# Patient Record
Sex: Female | Born: 1975 | Race: Black or African American | Hispanic: No | Marital: Married | Smoking: Never smoker
Health system: Southern US, Community
[De-identification: ages and names within clinical notes are randomized; demographics above are authoritative.]

## PROBLEM LIST (undated history)

## (undated) DIAGNOSIS — I82409 Acute embolism and thrombosis of unspecified deep veins of unspecified lower extremity: Secondary | ICD-10-CM

## (undated) DIAGNOSIS — I2699 Other pulmonary embolism without acute cor pulmonale: Secondary | ICD-10-CM

## (undated) HISTORY — DX: Acute embolism and thrombosis of unspecified deep veins of unspecified lower extremity: I82.409

## (undated) HISTORY — DX: Other pulmonary embolism without acute cor pulmonale: I26.99

---

## 2019-11-23 ENCOUNTER — Emergency Department (HOSPITAL_BASED_OUTPATIENT_CLINIC_OR_DEPARTMENT_OTHER)
Admission: EM | Admit: 2019-11-23 | Discharge: 2019-11-23 | Disposition: A | Discharge status: Home / Self Care | Payer: Managed Care, Other (non HMO) | Attending: Emergency Medicine | Admitting: Emergency Medicine

## 2019-11-23 ENCOUNTER — Emergency Department (HOSPITAL_BASED_OUTPATIENT_CLINIC_OR_DEPARTMENT_OTHER): Payer: Managed Care, Other (non HMO)

## 2019-11-23 ENCOUNTER — Other Ambulatory Visit: Payer: Self-pay

## 2019-11-23 ENCOUNTER — Encounter (HOSPITAL_BASED_OUTPATIENT_CLINIC_OR_DEPARTMENT_OTHER): Payer: Self-pay | Admitting: *Deleted

## 2019-11-23 DIAGNOSIS — M79604 Pain in right leg: Secondary | ICD-10-CM

## 2019-11-23 LAB — URINALYSIS, ROUTINE W REFLEX MICROSCOPIC
Bilirubin Urine: NEGATIVE
Glucose, UA: NEGATIVE mg/dL
Hgb urine dipstick: NEGATIVE
Ketones, ur: 40 mg/dL — AB
Leukocytes,Ua: NEGATIVE
Nitrite: NEGATIVE
Protein, ur: NEGATIVE mg/dL
Specific Gravity, Urine: 1.02 (ref 1.005–1.030)
pH: 6 (ref 5.0–8.0)

## 2019-11-23 LAB — BASIC METABOLIC PANEL
Anion gap: 11 (ref 5–15)
BUN: 15 mg/dL (ref 6–20)
CO2: 21 mmol/L — ABNORMAL LOW (ref 22–32)
Calcium: 9.1 mg/dL (ref 8.9–10.3)
Chloride: 106 mmol/L (ref 98–111)
Creatinine, Ser: 0.74 mg/dL (ref 0.44–1.00)
GFR calc Af Amer: >60 mL/min (ref >60)
GFR calc non Af Amer: >60 mL/min (ref >60)
Glucose, Bld: 96 mg/dL (ref 70–99)
Potassium: 4 mmol/L (ref 3.5–5.1)
Sodium: 138 mmol/L (ref 135–145)

## 2019-11-23 LAB — CBC
HCT: 39.3 % (ref 36.0–46.0)
Hemoglobin: 12.8 g/dL (ref 12.0–15.0)
MCH: 28.8 pg (ref 26.0–34.0)
MCHC: 32.6 g/dL (ref 30.0–36.0)
MCV: 88.3 fL (ref 80.0–100.0)
Platelets: 228 10*3/uL (ref 150–400)
RBC: 4.45 MIL/uL (ref 3.87–5.11)
RDW: 13.2 % (ref 11.5–15.5)
WBC: 5.6 10*3/uL (ref 4.0–10.5)
nRBC: 0 % (ref 0.0–0.2)

## 2019-11-23 LAB — PREGNANCY, URINE: Preg Test, Ur: NEGATIVE

## 2019-11-23 NOTE — ED Notes (Signed)
ED Provider at bedside. 

## 2019-11-23 NOTE — ED Triage Notes (Signed)
Right leg pain from her calf to her upper leg. Hx of blood clots. She is ambulatory.

## 2019-11-23 NOTE — Discharge Instructions (Addendum)
Work-up for the right hip and thigh pain without evidence of a deep vein thrombosis.  X-rays of the pelvis and hip without any significant bony abnormalities.  Still possible could be coming from the back area.  Or could be muscles around the hip that are causing the pain.  Recommend he follow-up with sports medicine upstairs.  They will be okay with take it from here in terms of needing an MRI or physical therapy.  In the meantime would recommend Motrin Advil or Aleve Naprosyn for the pain.

## 2019-11-23 NOTE — ED Provider Notes (Signed)
[facility] EMERGENCY DEPARTMENT Provider Note   CSN: [id number] Arrival date & time: 11/23/19  1728     History Chief Complaint  Patient presents with  . Leg Pain    Annette Lewis is a 44 y.o. female.  Patient with a complaint of kind of right hip down to knee pain.  Worse with flexion at the hip.  Some of the pain goes into the calf area.  All on the right side.  No history of any injury.  Is been bothering her for months.  Been getting worse lately.  Denies any significant numbness to the foot or weakness to the foot.  Denies any back pain.        History reviewed. No pertinent past medical history.  There are no problems to display for this patient.   History reviewed. No pertinent surgical history.   OB History   No obstetric history on file.     No family history on file.  Social History   Tobacco Use  . Smoking status: Never Smoker  . Smokeless tobacco: Never Used  Substance Use Topics  . Alcohol use: Not Currently  . Drug use: Never    Home Medications Prior to Admission medications   Not on File    Allergies    Patient has no known allergies.  Review of Systems   Review of Systems  Constitutional: Negative for chills and fever.  HENT: Negative for congestion, rhinorrhea and sore throat.   Eyes: Negative for visual disturbance.  Respiratory: Negative for cough and shortness of breath.   Cardiovascular: Negative for chest pain and leg swelling.  Gastrointestinal: Negative for abdominal pain, diarrhea, nausea and vomiting.  Genitourinary: Negative for dysuria.  Musculoskeletal: Negative for arthralgias, back pain, joint swelling and neck pain.  Skin: Negative for rash.  Neurological: Negative for dizziness, light-headedness and headaches.  Hematological: Does not bruise/bleed easily.  Psychiatric/Behavioral: Negative for confusion.    Physical Exam Updated Vital Signs BP 128/74   Pulse 84   Temp 99.2 F (37.3 C) (Oral)    Resp 18   Ht 1.588 m (5' 2.5")   Wt 93 kg   LMP 11/11/2019   SpO2 98%   BMI 36.90 kg/m   Physical Exam Vitals and nursing note reviewed.  Constitutional:      General: She is not in acute distress.    Appearance: She is well-developed.  HENT:     Head: Normocephalic and atraumatic.  Eyes:     Extraocular Movements: Extraocular movements intact.     Conjunctiva/sclera: Conjunctivae normal.     Pupils: Pupils are equal, round, and reactive to light.  Cardiovascular:     Rate and Rhythm: Normal rate and regular rhythm.     Heart sounds: No murmur.  Pulmonary:     Effort: Pulmonary effort is normal. No respiratory distress.     Breath sounds: Normal breath sounds.  Abdominal:     Palpations: Abdomen is soft.     Tenderness: There is no abdominal tenderness.  Musculoskeletal:        General: Swelling present. No tenderness or deformity. Normal range of motion.     Cervical back: Normal range of motion and neck supple.     Comments: May be some slight trace edema around the right ankle area.  Dorsalis pedis pulse 2+.  Good movement of ankle foot and knee.  But has some discomfort when she flexes her hip area.  Pain tends to radiate more to the lateral  aspect of the thigh and then sometimes down towards the top of the calf.  Skin:    General: Skin is warm and dry.     Capillary Refill: Capillary refill takes less than 2 seconds.  Neurological:     General: No focal deficit present.     Mental Status: She is alert and oriented to person, place, and time.     Cranial Nerves: No cranial nerve deficit.     Sensory: No sensory deficit.     Motor: No weakness.     ED Results / Procedures / Treatments   Labs (all labs ordered are listed, but only abnormal results are displayed) Labs Reviewed  BASIC METABOLIC PANEL - Abnormal; Notable for the following components:      Result Value   CO2 21 (*)    All other components within normal limits  URINALYSIS, ROUTINE W REFLEX MICROSCOPIC  - Abnormal; Notable for the following components:   APPearance CLOUDY (*)    Ketones, ur 40 (*)    All other components within normal limits  CBC  PREGNANCY, URINE    EKG None  Radiology US Venous Img Lower Right (DVT Study)  Result Date: 11/23/2019 CLINICAL DATA:  Right lower extremity pain.  History of DVT. EXAM: RIGHT LOWER EXTREMITY VENOUS DOPPLER ULTRASOUND TECHNIQUE: Gray-scale sonography with compression, as well as color and duplex ultrasound, were performed to evaluate the deep venous system(s) from the level of the common femoral vein through the popliteal and proximal calf veins. COMPARISON:  None. FINDINGS: VENOUS Normal compressibility of the common femoral, superficial femoral, and popliteal veins, as well as the visualized calf veins. Visualized portions of profunda femoral vein and great saphenous vein unremarkable. No filling defects to suggest DVT on grayscale or color Doppler imaging. Doppler waveforms show normal direction of venous flow, normal respiratory phasicity and response to augmentation. Limited views of the contralateral common femoral vein are unremarkable. OTHER None. Limitations: none IMPRESSION: No evidence of right lower extremity DVT. Electronically Signed   By: Keith Rake M.D.   On: 11/23/2019 19:41   DG Hip Unilat W or Wo Pelvis 2-3 Views Right  Result Date: 11/23/2019 CLINICAL DATA:  Right leg and thigh pain EXAM: DG HIP (WITH OR WITHOUT PELVIS) 2-3V RIGHT COMPARISON:  None. FINDINGS: Frontal view of the pelvis as well as frontal and frogleg lateral views of the right hip are obtained. No fracture, subluxation, or dislocation within either hip. Enthesopathic changes are seen along the greater trochanters and iliac crests bilaterally. Joint spaces are relatively well preserved. Soft tissues are normal. IMPRESSION: 1. No acute bony abnormality. 2. Prominent enthesopathic changes.  No significant arthritis. Electronically Signed   By: Randa Ngo M.D.    On: 11/23/2019 19:46    Procedures Procedures (including critical care time)  Medications Ordered in ED Medications - No data to display  ED Course  I have reviewed the triage vital signs and the nursing notes.  Pertinent labs & imaging results that were available during my care of the patient were reviewed by me and considered in my medical decision making (see chart for details).    MDM Rules/Calculators/A&P                     Work-up including Doppler study the right leg negative for DVT.  X-rays of the right hip and pelvis without any significant bony abnormalities.  Patient with chronic pain in that area.  Getting worse.  Will refer to sports  medicine.  Could be hip muscle type pain still possibly could be a nerve problem from the lower lumbar back area.  All acute problems have been ruled out appropriately.  Final Clinical Impression(s) / ED Diagnoses Final diagnoses:  Right leg pain    Rx / DC Orders ED Discharge Orders    None       Vanetta Mulders, MD 11/23/19 2051

## 2021-01-26 ENCOUNTER — Emergency Department (HOSPITAL_BASED_OUTPATIENT_CLINIC_OR_DEPARTMENT_OTHER): Payer: 59

## 2021-01-26 ENCOUNTER — Encounter (HOSPITAL_BASED_OUTPATIENT_CLINIC_OR_DEPARTMENT_OTHER): Payer: Self-pay | Admitting: *Deleted

## 2021-01-26 ENCOUNTER — Other Ambulatory Visit: Payer: Self-pay

## 2021-01-26 ENCOUNTER — Inpatient Hospital Stay (HOSPITAL_BASED_OUTPATIENT_CLINIC_OR_DEPARTMENT_OTHER)
Admission: EM | Admit: 2021-01-26 | Discharge: 2021-01-30 | DRG: 175 | Disposition: A | Discharge status: Home / Self Care | Payer: 59 | Attending: Internal Medicine | Admitting: Internal Medicine

## 2021-01-26 DIAGNOSIS — D689 Coagulation defect, unspecified: Secondary | ICD-10-CM | POA: Diagnosis present

## 2021-01-26 DIAGNOSIS — Z86718 Personal history of other venous thrombosis and embolism: Secondary | ICD-10-CM

## 2021-01-26 DIAGNOSIS — I2699 Other pulmonary embolism without acute cor pulmonale: Secondary | ICD-10-CM | POA: Diagnosis present

## 2021-01-26 DIAGNOSIS — I2602 Saddle embolus of pulmonary artery with acute cor pulmonale: Secondary | ICD-10-CM | POA: Diagnosis not present

## 2021-01-26 DIAGNOSIS — R0602 Shortness of breath: Secondary | ICD-10-CM | POA: Diagnosis not present

## 2021-01-26 DIAGNOSIS — E876 Hypokalemia: Secondary | ICD-10-CM | POA: Diagnosis not present

## 2021-01-26 DIAGNOSIS — I749 Embolism and thrombosis of unspecified artery: Secondary | ICD-10-CM

## 2021-01-26 DIAGNOSIS — I959 Hypotension, unspecified: Secondary | ICD-10-CM | POA: Diagnosis present

## 2021-01-26 DIAGNOSIS — Z20822 Contact with and (suspected) exposure to covid-19: Secondary | ICD-10-CM | POA: Diagnosis present

## 2021-01-26 DIAGNOSIS — D649 Anemia, unspecified: Secondary | ICD-10-CM | POA: Diagnosis present

## 2021-01-26 DIAGNOSIS — R778 Other specified abnormalities of plasma proteins: Secondary | ICD-10-CM

## 2021-01-26 DIAGNOSIS — D696 Thrombocytopenia, unspecified: Secondary | ICD-10-CM | POA: Diagnosis present

## 2021-01-26 DIAGNOSIS — Z6841 Body Mass Index (BMI) 40.0 and over, adult: Secondary | ICD-10-CM

## 2021-01-26 LAB — CBC WITH DIFFERENTIAL/PLATELET
Abs Immature Granulocytes: 0.02 10*3/uL (ref 0.00–0.07)
Basophils Absolute: 0 10*3/uL (ref 0.0–0.1)
Basophils Relative: 0 %
Eosinophils Absolute: 0.1 10*3/uL (ref 0.0–0.5)
Eosinophils Relative: 1 %
HCT: 36.8 % (ref 36.0–46.0)
Hemoglobin: 12.8 g/dL (ref 12.0–15.0)
Immature Granulocytes: 0 %
Lymphocytes Relative: 26 %
Lymphs Abs: 1.8 10*3/uL (ref 0.7–4.0)
MCH: 28.3 pg (ref 26.0–34.0)
MCHC: 34.8 g/dL (ref 30.0–36.0)
MCV: 81.2 fL (ref 80.0–100.0)
Monocytes Absolute: 0.4 10*3/uL (ref 0.1–1.0)
Monocytes Relative: 6 %
Neutro Abs: 4.5 10*3/uL (ref 1.7–7.7)
Neutrophils Relative %: 67 %
Platelets: 207 10*3/uL (ref 150–400)
RBC: 4.53 MIL/uL (ref 3.87–5.11)
RDW: 13.1 % (ref 11.5–15.5)
WBC: 6.9 10*3/uL (ref 4.0–10.5)
nRBC: 0 % (ref 0.0–0.2)

## 2021-01-26 LAB — COMPREHENSIVE METABOLIC PANEL
ALT: 17 U/L (ref 0–44)
AST: 23 U/L (ref 15–41)
Albumin: 3.7 g/dL (ref 3.5–5.0)
Alkaline Phosphatase: 53 U/L (ref 38–126)
Anion gap: 11 (ref 5–15)
BUN: 14 mg/dL (ref 6–20)
CO2: 21 mmol/L — ABNORMAL LOW (ref 22–32)
Calcium: 8.9 mg/dL (ref 8.9–10.3)
Chloride: 106 mmol/L (ref 98–111)
Creatinine, Ser: 0.72 mg/dL (ref 0.44–1.00)
GFR, Estimated: >60 mL/min (ref >60)
Glucose, Bld: 102 mg/dL — ABNORMAL HIGH (ref 70–99)
Potassium: 3.4 mmol/L — ABNORMAL LOW (ref 3.5–5.1)
Sodium: 138 mmol/L (ref 135–145)
Total Bilirubin: 0.3 mg/dL (ref 0.3–1.2)
Total Protein: 7 g/dL (ref 6.5–8.1)

## 2021-01-26 LAB — BRAIN NATRIURETIC PEPTIDE: B Natriuretic Peptide: 205.1 pg/mL — ABNORMAL HIGH (ref 0.0–100.0)

## 2021-01-26 LAB — RESP PANEL BY RT-PCR (FLU A&B, COVID) ARPGX2
Influenza A by PCR: NEGATIVE
Influenza B by PCR: NEGATIVE
SARS Coronavirus 2 by RT PCR: NEGATIVE

## 2021-01-26 LAB — TROPONIN I (HIGH SENSITIVITY)
Troponin I (High Sensitivity): 190 ng/L (ref <18)
Troponin I (High Sensitivity): 172 ng/L (ref <18)

## 2021-01-26 LAB — LIPASE, BLOOD: Lipase: 31 U/L (ref 11–51)

## 2021-01-26 LAB — APTT: aPTT: 26 seconds (ref 24–36)

## 2021-01-26 LAB — TSH: TSH: 3.59 u[IU]/mL (ref 0.350–4.500)

## 2021-01-26 LAB — MAGNESIUM: Magnesium: 1.9 mg/dL (ref 1.7–2.4)

## 2021-01-26 LAB — D-DIMER, QUANTITATIVE: D-Dimer, Quant: 9.71 ug/mL-FEU — ABNORMAL HIGH (ref 0.00–0.50)

## 2021-01-26 LAB — PROTIME-INR
INR: 1 (ref 0.8–1.2)
Prothrombin Time: 13.2 seconds (ref 11.4–15.2)

## 2021-01-26 MED ORDER — SODIUM CHLORIDE 0.9 % IV BOLUS
500.0000 mL | Freq: Once | INTRAVENOUS | Status: AC
  Administered 2021-01-26: 500 mL via INTRAVENOUS

## 2021-01-26 MED ORDER — HEPARIN BOLUS VIA INFUSION
4000.0000 [IU] | Freq: Once | INTRAVENOUS | Status: AC
  Administered 2021-01-26: 4000 [IU] via INTRAVENOUS

## 2021-01-26 MED ORDER — HEPARIN (PORCINE) 25000 UT/250ML-% IV SOLN
1400.0000 [IU]/h | INTRAVENOUS | Status: DC
  Administered 2021-01-26: 1400 [IU]/h via INTRAVENOUS
  Filled 2021-01-26: qty 250

## 2021-01-26 MED ORDER — IOHEXOL 350 MG/ML SOLN
75.0000 mL | Freq: Once | INTRAVENOUS | Status: AC | PRN
  Administered 2021-01-26: 75 mL via INTRAVENOUS

## 2021-01-26 NOTE — ED Provider Notes (Signed)
[facility] EMERGENCY DEPARTMENT Provider Note   CSN: [id number] Arrival date & time: 01/26/21  1842     History No chief complaint on file.   Annette Lewis is a 45 y.o. female presents to the Emergency Department complaining of gradual, persistent, progressively worsening palpitations and shortness of breath.  Patient reports that she has been utilizing the keto diet since the beginning of May and has felt well until around 3 AM on Wednesday morning when she was returning home after her overnight shift.  She reports at that time she had a near syncopal episode and felt short of breath.  She reports that she went home and drink some orange juice thinking her blood sugar might be low.  This helped her lightheadedness she is have persistent tachycardia, palpitations and shortness of breath since that time.  Patient reports approximately 8 weeks ago she went to [place] but has had no other travel in the last few weeks.  She reports several years ago she had a DVT secondary to regular travel.  She is not anticoagulated.  Has never had a pulmonary embolism.  Patient reports any walking creates significant shortness of breath.  Denies fever, chills, headache, neck pain, chest pain abdominal pain, nausea, vomiting, diarrhea weakness, urinary symptoms.   The history is provided by the patient and medical records. No language interpreter was used.       History reviewed. No pertinent past medical history.  Patient Active Problem List   Diagnosis Date Noted  . Pulmonary emboli (HCC) 01/26/2021  . Pulmonary embolus (HCC) 01/26/2021    Past Surgical History:  Procedure Laterality Date  . CESAREAN SECTION       OB History   No obstetric history on file.     No family history on file.  Social History   Tobacco Use  . Smoking status: Never Smoker  . Smokeless tobacco: Never Used  Substance Use Topics  . Alcohol use: Not Currently  . Drug use: Never    Home  Medications Prior to Admission medications   Not on File    Allergies    Patient has no known allergies.  Review of Systems   Review of Systems  Constitutional: Positive for fatigue. Negative for appetite change, diaphoresis, fever and unexpected weight change.  HENT: Negative for mouth sores.   Eyes: Negative for visual disturbance.  Respiratory: Positive for chest tightness and shortness of breath. Negative for cough and wheezing.   Cardiovascular: Positive for palpitations. Negative for chest pain.  Gastrointestinal: Negative for abdominal pain, constipation, diarrhea, nausea and vomiting.  Endocrine: Negative for polydipsia, polyphagia and polyuria.  Genitourinary: Negative for dysuria, frequency, hematuria and urgency.  Musculoskeletal: Negative for back pain and neck stiffness.  Skin: Negative for rash.  Allergic/Immunologic: Negative for immunocompromised state.  Neurological: Positive for light-headedness. Negative for syncope and headaches.  Hematological: Does not bruise/bleed easily.  Psychiatric/Behavioral: Negative for sleep disturbance. The patient is not nervous/anxious.     Physical Exam Updated Vital Signs BP (!) 97/58   Pulse (!) 116   Temp 97.9 F (36.6 C) (Oral)   Resp 18   Ht 5' 2.5" (1.588 m)   LMP 01/24/2021   SpO2 99%   BMI 36.90 kg/m   Physical Exam Vitals and nursing note reviewed.  Constitutional:      General: She is not in acute distress.    Appearance: She is not diaphoretic.  HENT:     Head: Normocephalic.  Eyes:  General: No scleral icterus.    Conjunctiva/sclera: Conjunctivae normal.  Cardiovascular:     Rate and Rhythm: Regular rhythm. Tachycardia present.     Pulses: Normal pulses.          Radial pulses are 2+ on the right side and 2+ on the left side.       Dorsalis pedis pulses are 2+ on the right side and 2+ on the left side.  Pulmonary:     Effort: No tachypnea, accessory muscle usage, prolonged expiration, respiratory  distress or retractions.     Breath sounds: Normal breath sounds. No stridor.     Comments: Equal chest rise. No increased work of breathing. Abdominal:     General: There is no distension.     Palpations: Abdomen is soft.     Tenderness: There is no abdominal tenderness. There is no guarding or rebound.  Musculoskeletal:     Cervical back: Normal range of motion.     Right lower leg: No edema.     Left lower leg: No edema.     Comments: Moves all extremities equally and without difficulty.  Skin:    General: Skin is warm and dry.     Capillary Refill: Capillary refill takes less than 2 seconds.  Neurological:     Mental Status: She is alert.     GCS: GCS eye subscore is 4. GCS verbal subscore is 5. GCS motor subscore is 6.     Comments: Speech is clear and goal oriented.  Psychiatric:        Mood and Affect: Mood normal.     ED Results / Procedures / Treatments   Labs (all labs ordered are listed, but only abnormal results are displayed) Labs Reviewed  COMPREHENSIVE METABOLIC PANEL - Abnormal; Notable for the following components:      Result Value   Potassium 3.4 (*)    CO2 21 (*)    Glucose, Bld 102 (*)    All other components within normal limits  D-DIMER, QUANTITATIVE - Abnormal; Notable for the following components:   D-Dimer, Quant 9.71 (*)    All other components within normal limits  BRAIN NATRIURETIC PEPTIDE - Abnormal; Notable for the following components:   B Natriuretic Peptide 205.1 (*)    All other components within normal limits  TROPONIN I (HIGH SENSITIVITY) - Abnormal; Notable for the following components:   Troponin I (High Sensitivity) 190 (*)    All other components within normal limits  TROPONIN I (HIGH SENSITIVITY) - Abnormal; Notable for the following components:   Troponin I (High Sensitivity) 172 (*)    All other components within normal limits  RESP PANEL BY RT-PCR (FLU A&B, COVID) ARPGX2  CBC WITH DIFFERENTIAL/PLATELET  LIPASE, BLOOD  TSH   MAGNESIUM  PROTIME-INR  APTT  PREGNANCY, URINE  URINALYSIS, ROUTINE W REFLEX MICROSCOPIC  HEPARIN LEVEL (UNFRACTIONATED)  CBC  TROPONIN I (HIGH SENSITIVITY)    EKG EKG Interpretation  Date/Time:  Thursday Jan 26 2021 18:52:05 EDT Ventricular Rate:  121 PR Interval:  134 QRS Duration: 72 QT Interval:  330 QTC Calculation: 468 R Axis:   50 Text Interpretation: Sinus tachycardia Low voltage QRS Cannot rule out Anterior infarct , age undetermined Abnormal ECG No previous ECGs available Confirmed by Richardean CanalYao, David H ([id number]) on 01/26/2021 6:57:01 PM   Radiology DG Chest 2 View  Result Date: 01/26/2021 CLINICAL DATA:  45 year old female with shortness of breath. EXAM: CHEST - 2 VIEW COMPARISON:  None. FINDINGS: Shallow inspiration. No  focal consolidation, pleural effusion, or pneumothorax. The cardiac silhouette is within limits. No acute osseous pathology. IMPRESSION: No active cardiopulmonary disease. Electronically Signed   By: Elgie Collard M.D.   On: 01/26/2021 20:56   CT Angio Chest PE W and/or Wo Contrast  Result Date: 01/26/2021 CLINICAL DATA:  Short of breath and dizzy EXAM: CT ANGIOGRAPHY CHEST WITH CONTRAST TECHNIQUE: Multidetector CT imaging of the chest was performed using the standard protocol during bolus administration of intravenous contrast. Multiplanar CT image reconstructions and MIPs were obtained to evaluate the vascular anatomy. CONTRAST:  73mL OMNIPAQUE IOHEXOL 350 MG/ML SOLN COMPARISON:  Chest x-ray 01/26/2021 FINDINGS: Cardiovascular: Satisfactory opacification of the pulmonary arteries to the segmental level. Saddle embolus straddling the right and left main pulmonary arteries. Extension of thrombus into right upper proximal segmental vessels, right middle lobe segmental and subsegmental vessels, right inter lobar artery and right lower lobe segmental and subsegmental pulmonary vessels. On the left, thrombus extends into left upper and lower lobe segmental and  subsegmental vessels. Thrombus within descending left pulmonary artery. Positive for right heart strain with RV LV ratio of 1.2. Normal cardiac size. No pericardial effusion. Nonaneurysmal aorta. Mediastinum/Nodes: No enlarged mediastinal, hilar, or axillary lymph nodes. Thyroid gland, trachea, and esophagus demonstrate no significant findings. Lungs/Pleura: Lungs are clear. No pleural effusion or pneumothorax. Upper Abdomen: No acute abnormality. Musculoskeletal: No chest wall abnormality. No acute or significant osseous findings. Review of the MIP images confirms the above findings. IMPRESSION: Extensive bilateral PE including large saddle embolus. Positive for acute PE with CT evidence of right heart strain (RV/LV Ratio = 1.2) consistent with at least submassive (intermediate risk) PE. The presence of right heart strain has been associated with an increased risk of morbidity and mortality. Please refer to the "PE Focused" order set in EPIC. Critical Value/emergent results were called by telephone at the time of interpretation on 01/26/2021 at 9:39 pm to provider [facility] , who verbally acknowledged these results. Electronically Signed   By: Jasmine Pang M.D.   On: 01/26/2021 21:39    Procedures .Critical Care Performed by: Dierdre Forth, PA-C Authorized by: Dierdre Forth, PA-C   Critical care provider statement:    Critical care time (minutes):  45   Critical care time was exclusive of:  Separately billable procedures and treating other patients and teaching time   Critical care was necessary to treat or prevent imminent or life-threatening deterioration of the following conditions:  Circulatory failure   Critical care was time spent personally by me on the following activities:  Discussions with consultants, evaluation of patient's response to treatment, examination of patient, ordering and performing treatments and interventions, ordering and review of laboratory studies,  ordering and review of radiographic studies, pulse oximetry, re-evaluation of patient's condition, obtaining history from patient or surrogate and review of old charts   I assumed direction of critical care for this patient from another provider in my specialty: no     Care discussed with: admitting provider       Medications Ordered in ED Medications  heparin ADULT infusion 100 units/mL (25000 units/276mL) (1,400 Units/hr Intravenous New Bag/Given 01/26/21 2217)  iohexol (OMNIPAQUE) 350 MG/ML injection 75 mL (75 mLs Intravenous Contrast Given 01/26/21 2111)  sodium chloride 0.9 % bolus 500 mL (0 mLs Intravenous Stopped 01/26/21 2257)  heparin bolus via infusion 4,000 Units (4,000 Units Intravenous Bolus from Bag 01/26/21 2218)    ED Course  I have reviewed the triage vital signs and the nursing notes.  Pertinent labs & imaging results that were available during my care of the patient were reviewed by me and considered in my medical decision making (see chart for details).  Clinical Course as of 01/27/21 0027  Thu Jan 26, 2021  2132 D-Dimer, Quant(!): 9.71 Significantly elevated.  PE is the #1 concern on my differential.  Will obtain CT angiogram. [HM]  2132 B Natriuretic Peptide(!): 205.1 Elevated [HM]  2132 Troponin I (High Sensitivity)(!!): 190 Elevated.  Suspect this is secondary to large saddle embolism.  Will heparinize. [HM]  2138 CT Angio Chest PE W and/or Wo Contrast Discussed imaging with radiology and I have personally reviewed this myself.  Patient with large saddle embolism with RV LV strain.  Will initiate code PE. [HM]    Clinical Course User Index [HM] Jaydis Duchene, Boyd Kerbs   MDM Rules/Calculators/A&P                           Patient presents with shortness of breath, tachycardia and palpitations.  Primary differential is pulmonary embolism.  Also considering electrolyte imbalance, dehydration, hyperthyroidism, myocarditis.  Work-up pending.  10:09 PM CT scan  with submassive PE, right heart strain, elevated BNP and elevated troponin.  Patient will need ICU admission as she has been borderline hypotensive.  Discussed with Dr. Mikki Harbor patient given 500 mL of fluid but will not give any additional.  If map drops below 65 we will start lytics and pressors.  12:26 AM Patient continues to have tachycardia.  Mild hypotension persists but MAP not below 65.  Patient admitted to the ICU and will be transferred.  The patient was discussed with and evaluated by Dr. Silverio Lay who agrees with the treatment plan.     Final Clinical Impression(s) / ED Diagnoses Final diagnoses:  Acute saddle pulmonary embolism with acute cor pulmonale (HCC)  Elevated troponin I level    Rx / DC Orders ED Discharge Orders    None       Kalai Baca, Boyd Kerbs 01/27/21 0027    Charlynne Pander, MD 01/31/21 [phone]

## 2021-01-26 NOTE — Progress Notes (Signed)
ANTICOAGULATION CONSULT NOTE - Initial Consult  Pharmacy Consult for Heparin Indication: pulmonary embolus  No Known Allergies  Patient Measurements: Height: 5\' 3"  (160 cm) Weight: 105 kg (231 lb 6.4 oz) IBW/kg (Calculated) : 52.4 Heparin Dosing Weight:  77.3 kg  Vital Signs: Temp: 97.9 F (36.6 C) (05/26 1849) Temp Source: Oral (05/26 1849) BP: 96/63 (05/26 2100) Pulse Rate: 107 (05/26 2100)  Labs: Recent Labs    01/26/21 2016  HGB 12.8  HCT 36.8  PLT 207  CREATININE 0.72  TROPONINIHS 190*    Estimated Creatinine Clearance: 104 mL/min (by C-G formula based on SCr of 0.72 mg/dL).   Medical History: History reviewed. No pertinent past medical history.  Assessment: CC/HPI: SOB, dizziness, heart palpitations Long trip 8 wks ago.  PMH: h/o DVT no longer on anticoag,   Anticoag: PE. Baseline Hgb 12.8. Plts 207.  Goal of Therapy:  Heparin level 0.3-0.7 units/ml Monitor platelets by anticoagulation protocol: Yes   Plan:  Heparin bolus 4000 units. Heparin infusion at 1400 units/hr Check heparin level and CBC daily.   Glennie Rodda S. 01/28/21, PharmD, BCPS Clinical Staff Pharmacist [email or web address] Merilynn Finland, Merilynn Finland 01/26/2021,9:54 PM

## 2021-01-26 NOTE — H&P (Signed)
NAMEAsani Lewis MRN:  [id number] DOB:  [birth year] LOS: 0 ADMISSION DATE:  01/26/2021 CONSULTATION DATE:  01/26/2021 REFERRING MD:  Silverio Lay [facility] CHIEF COMPLAINT:  PE   History of Present Illness:  45 year old female with PMHx significant for DVT who presented to [facility] ED 5/26 with palpitations and SOB. Experienced a near-syncopal episode with SOB 5/25AM and felt this was related to low blood sugar, drank orange juice which seemed to help lightheadedness but continued to have palpitations and SOB.  Patient reports that she had a DVT several years ago deemed 2/2 frequent travel, not on anticoagulation at present. Traveled to [place] ~8 weeks ago, no other recent travel. No prior history of PE, tobacco use, cancer or exogenous estrogen use. She notes she only take vitamins. No personal history of cancer. Did have a DVT several years ago that was attributed to stasis. Reports SOB with any walking/exertion. Noted on Wednesday that it was acutely worse.Noted she could not walk up the steps to her hourse that she can usually do easily. No cough or fevers. No congestion. Denies fever/chills, CP, n/v/d or abdominal pain.   CTA Chest 5/26 demonstrated submassive PE with e/o R heart strain (ratio 1.2). Labs demonstrated BNP 205 and troponin 190.  Transferred to [facility] ICU for PCCM admission.  Pertinent Medical History:  DVT  Significant Hospital Events: Including procedures, antibiotic start and stop dates in addition to other pertinent events   . 5/26 - Presented to [facility] with presyncope, SOB - CTA with submassive PE and R heart strain. Transferred to [facility] for higher level of care.  Interim History / Subjective:  NA  Objective:  Blood pressure 91/77, pulse (!) 104, temperature 97.9 F (36.6 C), temperature source Oral, resp. rate (!) 25, height 5\' 3"  (1.6 m), weight 105 kg, last menstrual period 01/24/2021, SpO2 100 %.       No intake or output data in the 24 hours ending 01/26/21 2247 Filed Weights    01/26/21 2147  Weight: 105 kg    Physical Examination: General:Patient resting comfortably in bed in no distress HEENT: Moist mucus membranes Neuro: No focal deficits. Intact  CV: Regular rate and rhythm PULM: CTAB 2148 non tender and non distended Extremities:No noted deformities Skin:No rashes or lesions noted  Labs/imaging that I have personally reviewed: (right click and "Reselect all SmartList Selections" daily)  WBC 6.9, H&H 12.8/36.8, Plt 207 INR 1.0  Na 138, K 3.4, CO2 21, BUN 14 ,Cr 0.72 Ca 8.9, Gluc 102, Mg 1.9  AST/ALT 23/17 Lipase 31  Trop 190 -> 172, BNP 205  TSH 3.59  Urine HCG negative  CTA Chest 5/26 Extensive bilateral PE including large saddle embolus. Positive for acute PE with CT evidence of right heart strain (RV/LV Ratio = 1.2) consistent with at least submassive (intermediate risk) PE  Resolved Hospital Problem List:     Assessment & Plan:   Acute saddle pulmonary embolus History of DVT Admitted with SOB. CTA demonstrating extensive bilateral PE with saddle embolus with evidence of R heart strain (RV/LV Ratio 1.2). PESI Score 74 (class II, low risk). Management options including conservative management, lytic administration and thrombectomy discussed with decision for heparin this morning and would be willing to discuss with IR in morning concerning thrombectomy. - Close monitoring; if patient decompensates or becomes hemodynamically unstable, would proceed with lytic administration versus IR thrombectomy - Therapeutic anticoagulation with heparin - Trend heparin level/aPTT - F/u Echo - F/u LE Dopplers - Eventual transition to PO anticoagulation  once nearing discharge - Consider outpatient hypercoagulability workup, given history of DVT and now unprovoked PE - Consult IR in the morning to discuss possible endovascular intervention/thrombectomy vs EKOS - NPO until discussion with IR concerning   Hypotension-Has resolved and been stable  overnight. Only seen transiently in the ED Likely related to submassive PE - Continue to monitor - Goal MAP > 65 - Consideration of lytic therapy initiation if patient remains hypotensive  Best Practice: (right click and "Reselect all SmartList Selections" daily)  Diet:  NPO Pain/Anxiety/Delirium protocol (if indicated): NA VAP protocol (if indicated): NA DVT prophylaxis:Heparin infusion GI prophylaxis:NA Glucose control: NA Central venous access: NA Arterial line: NA Foley:NA Mobility:  TBD PT consulted:TBD Last date of multidisciplinary goals of care discussion NA Code Status:  Full Disposition: ICU  Labs:   CBC: Recent Labs  Lab 01/26/21 2016  WBC 6.9  NEUTROABS 4.5  HGB 12.8  HCT 36.8  MCV 81.2  PLT 207    Basic Metabolic Panel: Recent Labs  Lab 01/26/21 2016  NA 138  K 3.4*  CL 106  CO2 21*  GLUCOSE 102*  BUN 14  CREATININE 0.72  CALCIUM 8.9  MG 1.9   GFR: Estimated Creatinine Clearance: 104 mL/min (by C-G formula based on SCr of 0.72 mg/dL). Recent Labs  Lab 01/26/21 2016  WBC 6.9    Liver Function Tests: Recent Labs  Lab 01/26/21 2016  AST 23  ALT 17  ALKPHOS 53  BILITOT 0.3  PROT 7.0  ALBUMIN 3.7   Recent Labs  Lab 01/26/21 2016  LIPASE 31   No results for input(s): AMMONIA in the last 168 hours.  ABG: No results found for: PHART, PCO2ART, PO2ART, HCO3, TCO2, ACIDBASEDEF, O2SAT   Coagulation Profile: No results for input(s): INR, PROTIME in the last 168 hours.  Cardiac Enzymes: No results for input(s): CKTOTAL, CKMB, CKMBINDEX, TROPONINI in the last 168 hours.  HbA1C: No results found for: HGBA1C  CBG: No results for input(s): GLUCAP in the last 168 hours.  Review of Systems:   Review of systems completed with pertinent positives/negatives outlined in above HPI.  Past Medical History:  She,  has no past medical history on file.   Surgical History:   Past Surgical History:  Procedure Laterality Date  . CESAREAN  SECTION       Social History:   reports that she has never smoked. She has never used smokeless tobacco. She reports previous alcohol use. She reports that she does not use drugs.   Family History:  Her family history is not on file.   Allergies No Known Allergies   Home Medications  Prior to Admission medications   Not on File     Critical care time: 50 minutes   Tim Lair, PA-C [facility] 01/26/21 10:47 PM  Please see [email or web address] for pager details.  From 7A-7P if no response, please call [phone] After hours, please call E-Link [phone]

## 2021-01-26 NOTE — ED Notes (Signed)
ED Provider at bedside. 

## 2021-01-26 NOTE — ED Triage Notes (Signed)
Sob yesterday, dizziness, and heart palpitations.

## 2021-01-26 NOTE — ED Notes (Signed)
Unsuccessful IV attempt, Notified Boneta Lucks, RT to attempt IV with ultrasound.

## 2021-01-27 ENCOUNTER — Inpatient Hospital Stay (HOSPITAL_COMMUNITY): Payer: 59

## 2021-01-27 DIAGNOSIS — I2699 Other pulmonary embolism without acute cor pulmonale: Secondary | ICD-10-CM

## 2021-01-27 DIAGNOSIS — E876 Hypokalemia: Secondary | ICD-10-CM | POA: Diagnosis not present

## 2021-01-27 DIAGNOSIS — I2601 Septic pulmonary embolism with acute cor pulmonale: Secondary | ICD-10-CM | POA: Diagnosis not present

## 2021-01-27 DIAGNOSIS — I2693 Single subsegmental pulmonary embolism without acute cor pulmonale: Secondary | ICD-10-CM

## 2021-01-27 DIAGNOSIS — I959 Hypotension, unspecified: Secondary | ICD-10-CM | POA: Diagnosis present

## 2021-01-27 DIAGNOSIS — D649 Anemia, unspecified: Secondary | ICD-10-CM | POA: Diagnosis present

## 2021-01-27 DIAGNOSIS — I2602 Saddle embolus of pulmonary artery with acute cor pulmonale: Principal | ICD-10-CM

## 2021-01-27 DIAGNOSIS — D696 Thrombocytopenia, unspecified: Secondary | ICD-10-CM | POA: Diagnosis present

## 2021-01-27 DIAGNOSIS — Z20822 Contact with and (suspected) exposure to covid-19: Secondary | ICD-10-CM | POA: Diagnosis present

## 2021-01-27 DIAGNOSIS — Z86718 Personal history of other venous thrombosis and embolism: Secondary | ICD-10-CM | POA: Diagnosis not present

## 2021-01-27 DIAGNOSIS — D689 Coagulation defect, unspecified: Secondary | ICD-10-CM | POA: Diagnosis present

## 2021-01-27 DIAGNOSIS — Z6841 Body Mass Index (BMI) 40.0 and over, adult: Secondary | ICD-10-CM | POA: Diagnosis not present

## 2021-01-27 DIAGNOSIS — R0602 Shortness of breath: Secondary | ICD-10-CM | POA: Diagnosis present

## 2021-01-27 HISTORY — PX: IR ANGIOGRAM PULMONARY BILATERAL SELECTIVE: IMG664

## 2021-01-27 HISTORY — PX: IR US GUIDE VASC ACCESS RIGHT: IMG2390

## 2021-01-27 HISTORY — PX: IR INFUSION THROMBOL ARTERIAL INITIAL (MS): IMG5376

## 2021-01-27 LAB — CBC
HCT: 33.6 % — ABNORMAL LOW (ref 36.0–46.0)
HCT: 34.6 % — ABNORMAL LOW (ref 36.0–46.0)
HCT: 38.8 % (ref 36.0–46.0)
Hemoglobin: 11 g/dL — ABNORMAL LOW (ref 12.0–15.0)
Hemoglobin: 11.2 g/dL — ABNORMAL LOW (ref 12.0–15.0)
Hemoglobin: 12.5 g/dL (ref 12.0–15.0)
MCH: 27.4 pg (ref 26.0–34.0)
MCH: 27.5 pg (ref 26.0–34.0)
MCH: 27.6 pg (ref 26.0–34.0)
MCHC: 32.2 g/dL (ref 30.0–36.0)
MCHC: 32.4 g/dL (ref 30.0–36.0)
MCHC: 32.7 g/dL (ref 30.0–36.0)
MCV: 83.8 fL (ref 80.0–100.0)
MCV: 85.2 fL (ref 80.0–100.0)
MCV: 85.5 fL (ref 80.0–100.0)
Platelets: 155 10*3/uL (ref 150–400)
Platelets: 158 10*3/uL (ref 150–400)
Platelets: 168 10*3/uL (ref 150–400)
RBC: 4.01 MIL/uL (ref 3.87–5.11)
RBC: 4.06 MIL/uL (ref 3.87–5.11)
RBC: 4.54 MIL/uL (ref 3.87–5.11)
RDW: 13.3 % (ref 11.5–15.5)
RDW: 13.5 % (ref 11.5–15.5)
RDW: 13.5 % (ref 11.5–15.5)
WBC: 5.7 10*3/uL (ref 4.0–10.5)
WBC: 6.1 10*3/uL (ref 4.0–10.5)
WBC: 6.3 10*3/uL (ref 4.0–10.5)
nRBC: 0 % (ref 0.0–0.2)
nRBC: 0 % (ref 0.0–0.2)
nRBC: 0 % (ref 0.0–0.2)

## 2021-01-27 LAB — PREGNANCY, URINE: Preg Test, Ur: NEGATIVE

## 2021-01-27 LAB — PHOSPHORUS: Phosphorus: 3.5 mg/dL (ref 2.5–4.6)

## 2021-01-27 LAB — ECHOCARDIOGRAM COMPLETE
Area-P 1/2: 4.21 cm2
Height: 63 in
S' Lateral: 2.6 cm
Weight: 3523.83 oz

## 2021-01-27 LAB — MRSA PCR SCREENING: MRSA by PCR: NEGATIVE

## 2021-01-27 LAB — HIV ANTIBODY (ROUTINE TESTING W REFLEX): HIV Screen 4th Generation wRfx: NONREACTIVE

## 2021-01-27 LAB — URINALYSIS, ROUTINE W REFLEX MICROSCOPIC
Bacteria, UA: NONE SEEN
Bilirubin Urine: NEGATIVE
Glucose, UA: 50 mg/dL — AB
Ketones, ur: 20 mg/dL — AB
Leukocytes,Ua: NEGATIVE
Nitrite: NEGATIVE
Protein, ur: 100 mg/dL — AB
RBC / HPF: >50 RBC/hpf — ABNORMAL HIGH (ref 0–5)
Specific Gravity, Urine: >1.046 — ABNORMAL HIGH (ref 1.005–1.030)
pH: 6 (ref 5.0–8.0)

## 2021-01-27 LAB — HEPARIN LEVEL (UNFRACTIONATED)
Heparin Unfractionated: >1.1 IU/mL — ABNORMAL HIGH (ref 0.30–0.70)
Heparin Unfractionated: 0.77 IU/mL — ABNORMAL HIGH (ref 0.30–0.70)
Heparin Unfractionated: 0.45 IU/mL (ref 0.30–0.70)

## 2021-01-27 LAB — BASIC METABOLIC PANEL
Anion gap: 13 (ref 5–15)
BUN: 12 mg/dL (ref 6–20)
CO2: 17 mmol/L — ABNORMAL LOW (ref 22–32)
Calcium: 8.7 mg/dL — ABNORMAL LOW (ref 8.9–10.3)
Chloride: 108 mmol/L (ref 98–111)
Creatinine, Ser: 0.62 mg/dL (ref 0.44–1.00)
GFR, Estimated: >60 mL/min (ref >60)
Glucose, Bld: 94 mg/dL (ref 70–99)
Potassium: 3.3 mmol/L — ABNORMAL LOW (ref 3.5–5.1)
Sodium: 138 mmol/L (ref 135–145)

## 2021-01-27 LAB — FIBRINOGEN: Fibrinogen: 361 mg/dL (ref 210–475)

## 2021-01-27 LAB — MAGNESIUM: Magnesium: 1.7 mg/dL (ref 1.7–2.4)

## 2021-01-27 LAB — TROPONIN I (HIGH SENSITIVITY): Troponin I (High Sensitivity): 117 ng/L (ref <18)

## 2021-01-27 MED ORDER — LIDOCAINE HCL 1 % IJ SOLN
INTRAMUSCULAR | Status: AC | PRN
Start: 2021-01-27 — End: 2021-01-27
  Administered 2021-01-27: 10 mL via INTRADERMAL

## 2021-01-27 MED ORDER — SODIUM CHLORIDE 0.9 % IV SOLN: INTRAVENOUS | Status: DC | PRN

## 2021-01-27 MED ORDER — POTASSIUM CHLORIDE CRYS ER 20 MEQ PO TBCR
40.0000 meq | EXTENDED_RELEASE_TABLET | ORAL | Status: AC
  Administered 2021-01-27 (×2): 40 meq via ORAL
  Filled 2021-01-27 (×2): qty 2

## 2021-01-27 MED ORDER — SODIUM CHLORIDE 0.9% FLUSH: 3.0000 mL | INTRAVENOUS | Status: DC | PRN

## 2021-01-27 MED ORDER — CHLORHEXIDINE GLUCONATE CLOTH 2 % EX PADS
6.0000 | MEDICATED_PAD | Freq: Every day | CUTANEOUS | Status: DC
  Administered 2021-01-27: 6 via TOPICAL

## 2021-01-27 MED ORDER — SODIUM CHLORIDE 0.9 % IV SOLN
12.0000 mg | Freq: Once | INTRAVENOUS | Status: AC
  Administered 2021-01-27: 12 mg via INTRAVENOUS
  Filled 2021-01-27: qty 12

## 2021-01-27 MED ORDER — MAGNESIUM SULFATE 2 GM/50ML IV SOLN
2.0000 g | Freq: Once | INTRAVENOUS | Status: AC
  Administered 2021-01-27: 2 g via INTRAVENOUS
  Filled 2021-01-27: qty 50

## 2021-01-27 MED ORDER — POLYETHYLENE GLYCOL 3350 17 G PO PACK
17.0000 g | PACK | Freq: Every day | ORAL | Status: DC | PRN
  Administered 2021-01-28: 17 g via ORAL
  Filled 2021-01-27: qty 1

## 2021-01-27 MED ORDER — FENTANYL CITRATE (PF) 100 MCG/2ML IJ SOLN
INTRAMUSCULAR | Status: AC
  Filled 2021-01-27: qty 2

## 2021-01-27 MED ORDER — POTASSIUM CHLORIDE 10 MEQ/100ML IV SOLN
10.0000 meq | INTRAVENOUS | Status: DC
  Administered 2021-01-27: 10 meq via INTRAVENOUS
  Filled 2021-01-27 (×4): qty 100

## 2021-01-27 MED ORDER — SODIUM CHLORIDE 0.9 % IV SOLN: INTRAVENOUS | Status: DC

## 2021-01-27 MED ORDER — MIDAZOLAM HCL 2 MG/2ML IJ SOLN
INTRAMUSCULAR | Status: AC
  Filled 2021-01-27: qty 2

## 2021-01-27 MED ORDER — HEPARIN (PORCINE) 25000 UT/250ML-% IV SOLN
950.0000 [IU]/h | INTRAVENOUS | Status: DC
  Administered 2021-01-27: 950 [IU]/h via INTRAVENOUS
  Filled 2021-01-27: qty 250

## 2021-01-27 MED ORDER — DOCUSATE SODIUM 100 MG PO CAPS: 100.0000 mg | ORAL_CAPSULE | Freq: Two times a day (BID) | ORAL | Status: DC | PRN

## 2021-01-27 MED ORDER — FENTANYL CITRATE (PF) 100 MCG/2ML IJ SOLN
INTRAMUSCULAR | Status: AC | PRN
  Administered 2021-01-27: 50 ug via INTRAVENOUS

## 2021-01-27 MED ORDER — SODIUM CHLORIDE 0.9% FLUSH
3.0000 mL | Freq: Two times a day (BID) | INTRAVENOUS | Status: DC
  Administered 2021-01-28 – 2021-01-30 (×4): 3 mL via INTRAVENOUS

## 2021-01-27 MED ORDER — SODIUM CHLORIDE 0.9 % IV SOLN: 250.0000 mL | INTRAVENOUS | Status: DC | PRN

## 2021-01-27 MED ORDER — IODIXANOL 320 MG/ML IV SOLN
50.0000 mL | Freq: Once | INTRAVENOUS | Status: AC | PRN
  Administered 2021-01-27: 15 mL via INTRA_ARTERIAL

## 2021-01-27 MED ORDER — MIDAZOLAM HCL 2 MG/2ML IJ SOLN
INTRAMUSCULAR | Status: AC | PRN
  Administered 2021-01-27: 1 mg via INTRAVENOUS

## 2021-01-27 MED ORDER — HEPARIN (PORCINE) 25000 UT/250ML-% IV SOLN
1100.0000 [IU]/h | INTRAVENOUS | Status: DC
  Administered 2021-01-27: 1100 [IU]/h via INTRAVENOUS

## 2021-01-27 MED ORDER — LIDOCAINE HCL (PF) 1 % IJ SOLN
INTRAMUSCULAR | Status: AC
  Filled 2021-01-27: qty 30

## 2021-01-27 NOTE — Progress Notes (Signed)
eLink Physician-Brief Progress Note Patient Name: Verba Ainley DOB: 07-Feb-1976 MRN: [id number]   Date of Service  01/27/2021  HPI/Events of Note  Intravenous KCL causing burning and arm irritation.  eICU Interventions  KCL iv discontinued, KCL 40 meq po Q 4 hours x 2 doses ordered.        Thomasene Lot Addalynne Golding 01/27/2021, 5:52 AM

## 2021-01-27 NOTE — Progress Notes (Signed)
ANTICOAGULATION CONSULT NOTE - Follow Up Consult  Pharmacy Consult for Heparin Indication: pulmonary embolus  No Known Allergies  Patient Measurements: Height: 5\' 3"  (160 cm) Weight: 99.9 kg (220 lb 3.8 oz) IBW/kg (Calculated) : 52.4 Heparin Dosing Weight: 77 kg  Vital Signs: Temp: 97.9 F (36.6 C) (05/27 1600) Temp Source: Oral (05/27 1600) BP: 115/87 (05/27 1800) Pulse Rate: 99 (05/27 1800)  Labs: Recent Labs    01/26/21 2016 01/26/21 2150 01/26/21 2215 01/27/21 0302 01/27/21 0418 01/27/21 1145 01/27/21 1803  HGB 12.8  --   --  11.0*  --   --  11.2*  HCT 36.8  --   --  33.6*  --   --  34.6*  PLT 207  --   --  168  --   --  155  APTT  --  26  --   --   --   --   --   LABPROT  --  13.2  --   --   --   --   --   INR  --  1.0  --   --   --   --   --   HEPARINUNFRC  --   --   --   --  >1.10* 0.77* 0.45  CREATININE 0.72  --   --  0.62  --   --   --   TROPONINIHS 190*  --  172* 117*  --   --   --     Estimated Creatinine Clearance: 101.2 mL/min (by C-G formula based on SCr of 0.62 mg/dL).   Medications:  Infusions:  . sodium chloride Stopped (01/27/21 0517)  . sodium chloride    . [START ON 01/28/2021] sodium chloride    . [START ON 01/28/2021] sodium chloride    . alteplase (tPA/ ACTIVASE) PE Lysis 12 mg/250 mL (BILATERAL)    . alteplase (tPA/ ACTIVASE) PE Lysis 12 mg/250 mL (BILATERAL)    . heparin 950 Units/hr (01/27/21 1800)    Assessment: 45 yo F with hx DVT no longer on anticoagulation presents with SOB, dizziness, heart palpitations.  Risks include travel to [place] 8 wks ago.  CTA + extensive bilateral PE with saddle emboluswithevidence of R heart strain. Pharmacy consulted to dose IV heparin.   Significant Events: - 5/27: catheter directed thrombolysis with TPA 1mg /hr x 12hr x 2 pulmonary catheters  Today, 01/27/2021: Heparin level 0.45 (therapeutic) - drawn ~5hrs after heparin rate decreased to 950 units/hr CBC:  Hgb stable 11.2, Plt down to 155 No  bleeding or complications reported.   Goal of Therapy:  Heparin level 0.3-0.7 units/ml Monitor platelets by anticoagulation protocol: Yes   Plan:  Continue heparin IV infusion at 950 units/hr Recheck Heparin level q6h x 4 post-procedure Daily heparin level and CBC Continue to monitor H&H and platelets   , PharmD, BCPS Clinical Pharmacist [facility] main pharmacy [phone] 01/27/2021 6:41 PM

## 2021-01-27 NOTE — Progress Notes (Signed)
ANTICOAGULATION CONSULT NOTE - Follow Up Consult  Pharmacy Consult for Heparin Indication: pulmonary embolus  No Known Allergies  Patient Measurements: Height: 5\' 3"  (160 cm) Weight: 99.9 kg (220 lb 3.8 oz) IBW/kg (Calculated) : 52.4 Heparin Dosing Weight: 77 kg  Vital Signs: Temp: 98.2 F (36.8 C) (05/27 0355) Temp Source: Oral (05/27 0355) BP: 111/71 (05/27 0700) Pulse Rate: 95 (05/27 0700)  Labs: Recent Labs    01/26/21 2016 01/26/21 2150 01/26/21 2215 01/27/21 0302 01/27/21 0418  HGB 12.8  --   --  11.0*  --   HCT 36.8  --   --  33.6*  --   PLT 207  --   --  168  --   APTT  --  26  --   --   --   LABPROT  --  13.2  --   --   --   INR  --  1.0  --   --   --   HEPARINUNFRC  --   --   --   --  >1.10*  CREATININE 0.72  --   --  0.62  --   TROPONINIHS 190*  --  172* 117*  --     Estimated Creatinine Clearance: 101.2 mL/min (by C-G formula based on SCr of 0.62 mg/dL).   Medications:  Infusions:  . sodium chloride Stopped (01/27/21 0517)  . heparin 1,100 Units/hr (01/27/21 01/29/21)  . magnesium sulfate bolus IVPB      Assessment: 45 yo F with hx DVT no longer on anticoagulation presents with SOB, dizziness, heart palpitations.  Risks include travel to [place] 8 wks ago.  CTA + extensive bilateral PE with saddle emboluswithevidence of R heart strain. Pharmacy consulted to dose IV heparin.   Today, 01/27/2021: Heparin level 0.77 on heparin 1100 units/hr CBC:  Hgb decreased to 11, Plt down to 168 No bleeding or complications reported.   Goal of Therapy:  Heparin level 0.3-0.7 units/ml Monitor platelets by anticoagulation protocol: Yes   Plan:  Decrease to heparin IV infusion at 950 units/hr Heparin level 6 hours after starting Daily heparin level and CBC Continue to monitor H&H and platelets   01/29/2021 PharmD, BCPS Clinical Pharmacist [facility] main pharmacy [phone] 01/27/2021 8:38 AM

## 2021-01-27 NOTE — ED Notes (Signed)
Report given to Wes with [facility] 

## 2021-01-27 NOTE — Progress Notes (Signed)
eLink Physician-Brief Progress Note Patient Name: Heydi Swango DOB: [birth year] MRN: [id number]   Date of Service  01/27/2021  HPI/Events of Note  Patient admitted with sub-massive PE with right heart strain, currently on Heparin infusion , standard work up and Rx for PE in progress.  eICU Interventions  New Patient Evaluation.        Thomasene Lot Blakelyn Dinges 01/27/2021, 1:58 AM

## 2021-01-27 NOTE — Plan of Care (Signed)
New admission to room 1224. Physician at bedside. No signs of distress upon admission and patient denies adverse symptoms.   Problem: Education: Goal: Knowledge of General Education information will improve Description: Including pain rating scale, medication(s)/side effects and non-pharmacologic comfort measures Outcome: Progressing   Problem: Health Behavior/Discharge Planning: Goal: Ability to manage health-related needs will improve Outcome: Progressing   Problem: Clinical Measurements: Goal: Ability to maintain clinical measurements within normal limits will improve Outcome: Progressing Goal: Will remain free from infection Outcome: Progressing Goal: Diagnostic test results will improve Outcome: Progressing Goal: Respiratory complications will improve Outcome: Progressing Goal: Cardiovascular complication will be avoided Outcome: Progressing   Problem: Activity: Goal: Risk for activity intolerance will decrease Outcome: Progressing   Problem: Nutrition: Goal: Adequate nutrition will be maintained Outcome: Progressing   Problem: Coping: Goal: Level of anxiety will decrease Outcome: Progressing   Problem: Elimination: Goal: Will not experience complications related to bowel motility Outcome: Progressing Goal: Will not experience complications related to urinary retention Outcome: Progressing   Problem: Pain Managment: Goal: General experience of comfort will improve Outcome: Progressing   Problem: Safety: Goal: Ability to remain free from injury will improve Outcome: Progressing   Problem: Skin Integrity: Goal: Risk for impaired skin integrity will decrease Outcome: Progressing

## 2021-01-27 NOTE — Consult Note (Signed)
Chief Complaint: Patient was seen in consultation today for pulmonary arteriogram with catheter directed thrombolytic therapy for bilateral pulmonary emboli  Referring Physician(s): Dewald,J  Supervising Physician: Gilmer MorWagner, Jaime  [facility]  History of Present Illness: Annette BorrowDavorah Lewis is a 45 y.o. female with past medical history of lower extremity D[place] ago [place] treated with 6 months of Xarelto therapy.  She was [facility] 5/26 for worsening palpitations, dyspnea with exertion and lightheadedness/presyncopal episode.  She has no other significant medical history, including malignancy, surgical procedures, bleeding diathesis,  recent stroke, hypertension, lung or heart disease. She  is a non-smoker.  Reports no history of COVID-19.  CT angiography of the chest on admission revealed extensive bilateral PE including large saddle embolus, CT evidence of right heart strain.  Bilateral lower extremity venous Doppler study today revealed no DVT.  Echocardiogram today revealed:  1. Severe RV dilation and moderately reduced function consistent with cor pulmonale in setting of acute PE. 2. Left ventricular ejection fraction, by estimation, is 60 to 65%. The left ventricle has normal function. The left ventricle has no regional wall motion abnormalities. Left ventricular diastolic parameters were normal. There is the interventricular septum is flattened in systole and diastole, consistent with right ventricular pressure and volume overload. 3. Right ventricular systolic function is moderately reduced. The right ventricular size is severely enlarged. There is normal pulmonary artery systolic pressure. The estimated right ventricular systolic pressure is 29.7 mmHg. 4. Right atrial size was mildly dilated. 5. The mitral valve is grossly normal. No evidence of mitral valve regurgitation. No evidence of mitral stenosis. 6. The aortic  valve is tricuspid. Aortic valve regurgitation is not visualized. No aortic stenosis is present. 7. The inferior vena cava is dilated in size with >50% respiratory variability, suggesting right atrial pressure of 8 mmHg.  She is currently on IV heparin; latest labs include normal creatinine, potassium 3.3, COVID-19 negative, WBC normal, hemoglobin 11, platelets 168k, troponin 117, BNP 205, D-dimer 9.7; request now received from critical care team for pulmonary arteriogram with thrombolytic therapy of PE  History reviewed. No pertinent past medical history.  Past Surgical History:  Procedure Laterality Date  . CESAREAN SECTION      Allergies: Patient has no known allergies.  Medications: Prior to Admission medications   Medication Sig Start Date End Date Taking? Authorizing Provider  cholecalciferol (VITAMIN D3) 25 MCG (1000 UNIT) tablet Take 1,000 Units by mouth daily.   Yes [provider]  magnesium 30 MG tablet Take 30 mg by mouth 2 (two) times daily.   Yes [provider]  Multiple Vitamin (MULTIVITAMIN) tablet Take 1 tablet by mouth daily.   Yes [provider]  POTASSIUM CHLORIDE PO Take 15 mLs by mouth daily. Potassium salt.   Yes [provider]     No family history on file.  Social History   Socioeconomic History  . Marital status: Married    Spouse name: Not on file  . Number of children: Not on file  . Years of education: Not on file  . Highest education level: Not on file  Occupational History  . Not on file  Tobacco Use  . Smoking status: Never Smoker  . Smokeless tobacco: Never Used  Substance and Sexual Activity  . Alcohol use: Not Currently  . Drug use: Never  . Sexual activity: Not on file  Other Topics Concern  . Not on file  Social History Narrative  .  Not on file   Social Determinants of Health   Financial Resource Strain: Not on file  Food Insecurity: Not on file  Transportation Needs: Not on file   Physical Activity: Not on file  Stress: Not on file  Social Connections: Not on file      Review of Systems currently denies fever, headache, chest pain, cough, abdominal/back pain, nausea, vomiting or bleeding.  She does have some dyspnea with exertion.  Vital Signs: BP 109/79   Pulse 92   Temp 98 F (36.7 C) (Oral)   Resp 17   Ht  (1.6 m)   Wt 220 lb 3.8 oz (99.9 kg)   LMP 01/24/2021   SpO2 99%   BMI 39.01 kg/m   Physical Exam awake, alert.  Chest clear to auscultation bilaterally.  Heart with slightly tachycardic but regular rhythm.  Abdomen soft, positive bowel sounds, nontender.  No lower extremity edema.  Imaging: DG Chest 2 View  Result Date: 01/26/2021 CLINICAL DATA:  45 year old female with shortness of breath. EXAM: CHEST - 2 VIEW COMPARISON:  None. FINDINGS: Shallow inspiration. No focal consolidation, pleural effusion, or pneumothorax. The cardiac silhouette is within limits. No acute osseous pathology. IMPRESSION: No active cardiopulmonary disease. Electronically Signed   By: Elgie Collard M.D.   On: 01/26/2021 20:56   CT Angio Chest PE W and/or Wo Contrast  Result Date: 01/26/2021 CLINICAL DATA:  Short of breath and dizzy EXAM: CT ANGIOGRAPHY CHEST WITH CONTRAST TECHNIQUE: Multidetector CT imaging of the chest was performed using the standard protocol during bolus administration of intravenous contrast. Multiplanar CT image reconstructions and MIPs were obtained to evaluate the vascular anatomy. CONTRAST:  75mL OMNIPAQUE IOHEXOL 350 MG/ML SOLN COMPARISON:  Chest x-ray 01/26/2021 FINDINGS: Cardiovascular: Satisfactory opacification of the pulmonary arteries to the segmental level. Saddle embolus straddling the right and left main pulmonary arteries. Extension of thrombus into right upper proximal segmental vessels, right middle lobe segmental and subsegmental vessels, right inter lobar artery and right lower lobe segmental and subsegmental pulmonary vessels. On  the left, thrombus extends into left upper and lower lobe segmental and subsegmental vessels. Thrombus within descending left pulmonary artery. Positive for right heart strain with RV LV ratio of 1.2. Normal cardiac size. No pericardial effusion. Nonaneurysmal aorta. Mediastinum/Nodes: No enlarged mediastinal, hilar, or axillary lymph nodes. Thyroid gland, trachea, and esophagus demonstrate no significant findings. Lungs/Pleura: Lungs are clear. No pleural effusion or pneumothorax. Upper Abdomen: No acute abnormality. Musculoskeletal: No chest wall abnormality. No acute or significant osseous findings. Review of the MIP images confirms the above findings. IMPRESSION: Extensive bilateral PE including large saddle embolus. Positive for acute PE with CT evidence of right heart strain (RV/LV Ratio = 1.2) consistent with at least submassive (intermediate risk) PE. The presence of right heart strain has been associated with an increased risk of morbidity and mortality. Please refer to the "PE Focused" order set in EPIC. Critical Value/emergent results were called by telephone at the time of interpretation on 01/26/2021 at 9:39 pm to provider [facility] , who verbally acknowledged these results. Electronically Signed   By: Jasmine Pang M.D.   On: 01/26/2021 21:39   ECHOCARDIOGRAM COMPLETE  Result Date: 01/27/2021    ECHOCARDIOGRAM REPORT   [facility] Date of Exam: 01/27/2021 Medical Rec #:  [id number]     Height:       63.0 in Accession #:    [id number]    Weight:       220.2  lb Date of Birth:  [birth year]     BSA:          2.015 m Patient Age:    44 years      BP:           105/67 mmHg Patient Gender: F             HR:           92 bpm. Exam Location:  Inpatient Procedure: 2D Echo, Cardiac Doppler and Color Doppler Indications:     Pulmonary embolus  History:         Patient has no prior history of Echocardiogram examinations.  Sonographer:     Shirlean Kelly Referring Phys:  [id number]  Elenore Paddy REESE Diagnosing Phys: Lennie Odor MD IMPRESSIONS  1. Severe RV dilation and moderately reduced function consistent with cor pulmonale in setting of acute PE.  2. Left ventricular ejection fraction, by estimation, is 60 to 65%. The left ventricle has normal function. The left ventricle has no regional wall motion abnormalities. Left ventricular diastolic parameters were normal. There is the interventricular septum is flattened in systole and diastole, consistent with right ventricular pressure and volume overload.  3. Right ventricular systolic function is moderately reduced. The right ventricular size is severely enlarged. There is normal pulmonary artery systolic pressure. The estimated right ventricular systolic pressure is 29.7 mmHg.  4. Right atrial size was mildly dilated.  5. The mitral valve is grossly normal. No evidence of mitral valve regurgitation. No evidence of mitral stenosis.  6. The aortic valve is tricuspid. Aortic valve regurgitation is not visualized. No aortic stenosis is present.  7. The inferior vena cava is dilated in size with >50% respiratory variability, suggesting right atrial pressure of 8 mmHg. Conclusion(s)/Recommendation(s): Findings consistent with Cor Pulmonale. FINDINGS  Left Ventricle: Left ventricular ejection fraction, by estimation, is 60 to 65%. The left ventricle has normal function. The left ventricle has no regional wall motion abnormalities. The left ventricular internal cavity size was small. There is no left ventricular hypertrophy. The interventricular septum is flattened in systole and diastole, consistent with right ventricular pressure and volume overload. Left ventricular diastolic parameters were normal. Right Ventricle: The right ventricular size is severely enlarged. No increase in right ventricular wall thickness. Right ventricular systolic function is moderately reduced. There is normal pulmonary artery systolic pressure. The tricuspid regurgitant  velocity is 2.33 m/s, and with an assumed right atrial pressure of 8 mmHg, the estimated right ventricular systolic pressure is 29.7 mmHg. Left Atrium: Left atrial size was normal in size. Right Atrium: Right atrial size was mildly dilated. Pericardium: Trivial pericardial effusion is present. Mitral Valve: The mitral valve is grossly normal. No evidence of mitral valve regurgitation. No evidence of mitral valve stenosis. Tricuspid Valve: The tricuspid valve is grossly normal. Tricuspid valve regurgitation is trivial. No evidence of tricuspid stenosis. Aortic Valve: The aortic valve is tricuspid. Aortic valve regurgitation is not visualized. No aortic stenosis is present. Pulmonic Valve: The pulmonic valve was grossly normal. Pulmonic valve regurgitation is trivial. No evidence of pulmonic stenosis. Aorta: The aortic root and ascending aorta are structurally normal, with no evidence of dilitation. Venous: The inferior vena cava is dilated in size with greater than 50% respiratory variability, suggesting right atrial pressure of 8 mmHg. IAS/Shunts: The atrial septum is grossly normal.  LEFT VENTRICLE PLAX 2D LVIDd:         3.60 cm  Diastology LVIDs:  2.60 cm  LV e' medial:    9.79 cm/s LV PW:         0.90 cm  LV E/e' medial:  6.4 LV IVS:        0.90 cm  LV e' lateral:   9.03 cm/s LVOT diam:     1.80 cm  LV E/e' lateral: 7.0 LV SV:         33 LV SV Index:   16 LVOT Area:     2.54 cm  RIGHT VENTRICLE            IVC RV S prime:     9.90 cm/s  IVC diam: 2.30 cm TAPSE (M-mode): 2.2 cm LEFT ATRIUM             Index       RIGHT ATRIUM           Index LA diam:        2.60 cm 1.29 cm/m  RA Area:     13.50 cm LA Vol (A2C):   20.8 ml 10.33 ml/m RA Volume:   36.60 ml  18.17 ml/m LA Vol (A4C):   21.0 ml 10.42 ml/m LA Biplane Vol: 22.4 ml 11.12 ml/m  AORTIC VALVE LVOT Vmax:   100.65 cm/s LVOT Vmean:  62.500 cm/s LVOT VTI:    0.128 m  AORTA Ao Root diam: 2.50 cm Ao Asc diam:  3.00 cm MITRAL VALVE                TRICUSPID VALVE MV Area (PHT): 4.21 cm    TR Peak grad:   21.7 mmHg MV Decel Time: 180 msec    TR Vmax:        233.00 cm/s MV E velocity: 63.10 cm/s MV A velocity: 56.30 cm/s  SHUNTS MV E/A ratio:  1.12        Systemic VTI:  0.13 m                            Systemic Diam: 1.80 cm Lennie Odor MD Electronically signed by Lennie Odor MD Signature Date/Time: 01/27/2021/2:16:30 PM    Final (Updated)    VAS [place] LOWER EXTREMITY VENOUS (DVT)  Result Date: 01/27/2021  Lower Venous DVT Study Patient Name:  [facility]  Date of Exam:   01/27/2021 Medical Rec #: [id number]      Accession #:    [id number] Date of Birth: [birth year]      Patient Gender: F Patient Age:   42Y Exam Location:  [facility] Procedure:      VAS [place] LOWER EXTREMITY VENOUS (DVT) Referring Phys: [id number] STEPHANIE M REESE --------------------------------------------------------------------------------  Indications: Pulmonary embolism.  Risk Factors: Confirmed PE. Anticoagulation: Heparin. Limitations: Poor ultrasound/tissue interface. Comparison Study: No prior studies. Performing Technologist: Chanda Busing RVT  Examination Guidelines: A complete evaluation includes B-mode imaging, spectral Doppler, color Doppler, and power Doppler as needed of all accessible portions of each vessel. Bilateral testing is considered an integral part of a complete examination. Limited examinations for reoccurring indications may be performed as noted. The reflux portion of the exam is performed with the patient in reverse Trendelenburg.  +---------+---------------+---------+-----------+----------+--------------+ RIGHT    CompressibilityPhasicitySpontaneityPropertiesThrombus Aging +---------+---------------+---------+-----------+----------+--------------+ CFV      Full           Yes      Yes                                 +---------+---------------+---------+-----------+----------+--------------+  SFJ      Full                                                         +---------+---------------+---------+-----------+----------+--------------+ FV Prox  Full                                                        +---------+---------------+---------+-----------+----------+--------------+ FV Mid   Full                                                        +---------+---------------+---------+-----------+----------+--------------+ FV DistalFull                                                        +---------+---------------+---------+-----------+----------+--------------+ PFV      Full                                                        +---------+---------------+---------+-----------+----------+--------------+ POP      Full           Yes      Yes                                 +---------+---------------+---------+-----------+----------+--------------+ PTV      Full                                                        +---------+---------------+---------+-----------+----------+--------------+ PERO     Full                                                        +---------+---------------+---------+-----------+----------+--------------+   +---------+---------------+---------+-----------+----------+--------------+ LEFT     CompressibilityPhasicitySpontaneityPropertiesThrombus Aging +---------+---------------+---------+-----------+----------+--------------+ CFV      Full           Yes      Yes                                 +---------+---------------+---------+-----------+----------+--------------+ SFJ      Full                                                        +---------+---------------+---------+-----------+----------+--------------+  FV Prox  Full                                                        +---------+---------------+---------+-----------+----------+--------------+ FV Mid   Full                                                         +---------+---------------+---------+-----------+----------+--------------+ FV DistalFull                                                        +---------+---------------+---------+-----------+----------+--------------+ PFV      Full                                                        +---------+---------------+---------+-----------+----------+--------------+ POP      Full           Yes      Yes                                 +---------+---------------+---------+-----------+----------+--------------+ PTV      Full                                                        +---------+---------------+---------+-----------+----------+--------------+ PERO     Full                                                        +---------+---------------+---------+-----------+----------+--------------+     Summary: RIGHT: - There is no evidence of deep vein thrombosis in the lower extremity.  - No cystic structure found in the popliteal fossa.  LEFT: - There is no evidence of deep vein thrombosis in the lower extremity.  - No cystic structure found in the popliteal fossa.  *See table(s) above for measurements and observations. Electronically signed by Sherald Hess MD on 01/27/2021 at 12:31:54 PM.    Final     Labs:  CBC: Recent Labs    01/26/21 2016 01/27/21 0302  WBC 6.9 6.3  HGB 12.8 11.0*  HCT 36.8 33.6*  PLT 207 168    COAGS: Recent Labs    01/26/21 2150  INR 1.0  APTT 26    BMP: Recent Labs    01/26/21 2016 01/27/21 0302  NA 138 138  K 3.4* 3.3*  CL 106 108  CO2 21* 17*  GLUCOSE 102* 94  BUN 14 12  CALCIUM 8.9 8.7*  CREATININE 0.72 0.62  GFRNONAA >60 >60  LIVER FUNCTION TESTS: Recent Labs    01/26/21 2016  BILITOT 0.3  AST 23  ALT 17  ALKPHOS 53  PROT 7.0  ALBUMIN 3.7    TUMOR MARKERS: No results for input(s): AFPTM, CEA, CA199, CHROMGRNA in the last 8760 hours.  Assessment and Plan:  45 y.o. female with past medical history of  lower extremity DVT approximately 4 years ago in [place] treated with 6 months of Xarelto therapy.  She was admitted to [facility] on 5/26 for worsening palpitations, dyspnea with exertion and lightheadedness/presyncopal episode.  She has no other significant medical history, including malignancy, surgical procedures, bleeding diathesis, recent stroke, hypertension, lung or heart disease.  She does have a reported history of benign intracranial hypertension diagnosed from MRI brain in 2012 at outside facility.  She  is a non-smoker.  Reports no history of COVID-19.  CT angiography of the chest on admission revealed extensive bilateral PE including large saddle embolus, CT evidence of right heart strain.  Bilateral lower extremity venous Doppler study today revealed no DVT.  Echocardiogram today revealed:  1. Severe RV dilation and moderately reduced function consistent with cor pulmonale in setting of acute PE. 2. Left ventricular ejection fraction, by estimation, is 60 to 65%. The left ventricle has normal function. The left ventricle has no regional wall motion abnormalities. Left ventricular diastolic parameters were normal. There is the interventricular septum is flattened in systole and diastole, consistent with right ventricular pressure and volume overload. 3. Right ventricular systolic function is moderately reduced. The right ventricular size is severely enlarged. There is normal pulmonary artery systolic pressure. The estimated right ventricular systolic pressure is 29.7 mmHg. 4. Right atrial size was mildly dilated. 5. The mitral valve is grossly normal. No evidence of mitral valve regurgitation. No evidence of mitral stenosis. 6. The aortic valve is tricuspid. Aortic valve regurgitation is not visualized. No aortic stenosis is present. 7. The inferior vena cava is dilated in size with >50% respiratory variability, suggesting right atrial pressure of 8 mmHg.  She is currently  on IV heparin; latest labs include normal creatinine, potassium 3.3, COVID-19 negative, WBC normal, hemoglobin 11, platelets 168k, troponin 117, BNP 205, D-dimer 9.7; request now received from critical care team for pulmonary arteriogram with thrombolytic therapy of PE; case has been reviewed by Dr. Loreta Ave.  Details/risks of procedure, including but not limited to, internal bleeding, infection, injury to adjacent structures, inability to completely lyse clot discussed with patient and spouse with their understanding and consent.  Procedure scheduled for this afternoon.   Thank you for this interesting consult.  I greatly enjoyed meeting Annette Lewis and look forward to participating in their care.  A copy of this report was sent to the requesting provider on this date.  Electronically Signed: D. Jeananne Rama, PA-C 01/27/2021, 3:04 PM   I spent a total of 40 minutes  in face to face in clinical consultation, greater than 50% of which was counseling/coordinating care for pulmonary arteriogram with catheter directed thrombolytic therapy for pulmonary emboli

## 2021-01-27 NOTE — Procedures (Addendum)
Interventional Radiology Procedure Note  Procedure:   US guided access right CFV.  Placement of bilateral pulmonary artery thrombolysis catheter. 2 x 90cm working length, 10cm infusion length catheters.  Initiation of tPA lysis. .  Complications: None  Plan: - each catheter will receive 1mg /hr tPA x 12 hours, for total of 24 mg tPA in 12 hours - each sheath will receive KVO saline ggt - low dose heparin protocol during lysis  Recommendations:  - Right hip straight overnight until catheters removed - baseline fibrinogen, then fibrinogen every 6 hours, x 3 draws  - Q 6 hour H&H x 3 draws - Q 6 hr heparin level/PTT, thanks to pharmacy for assist with heparin protocol during lysis - clear liquid diet until midnight, NPO past midnight - Plan to remove bilateral catheters on Saturday morning. - continue ICU Care - Call with any new bleeding, or severe headache/obtundation/MS changes  Signed,  Friday. Yvone Neu, DO

## 2021-01-27 NOTE — ED Notes (Signed)
Report given to [facility] RN at [employer].

## 2021-01-27 NOTE — Progress Notes (Signed)
Within twenty minutes of starting IV heparin infusion with NS carrier, pt c/o pain and burning from IV site up her arm. Attempted to palliate by decreasing rate, but this was unsuccessful. Called [facility] and spoke to [employer], she will get oral potassium replacement ordered.

## 2021-01-27 NOTE — Progress Notes (Signed)
Specimen sent for urine. Patient on menstrual cycle at this time.

## 2021-01-27 NOTE — Progress Notes (Signed)
ANTICOAGULATION CONSULT NOTE  Pharmacy Consult for Heparin Indication: pulmonary embolus  No Known Allergies  Patient Measurements: Height: 5\' 3"  (160 cm) Weight: 99.9 kg (220 lb 3.8 oz) IBW/kg (Calculated) : 52.4 Heparin Dosing Weight:  77.3 kg  Vital Signs: Temp: 98.2 F (36.8 C) (05/27 0355) Temp Source: Oral (05/27 0355) BP: 101/71 (05/27 0300) Pulse Rate: 101 (05/27 0300)  Labs: Recent Labs    01/26/21 2016 01/26/21 2150 01/26/21 2215 01/27/21 0302 01/27/21 0418  HGB 12.8  --   --  11.0*  --   HCT 36.8  --   --  33.6*  --   PLT 207  --   --  168  --   APTT  --  26  --   --   --   LABPROT  --  13.2  --   --   --   INR  --  1.0  --   --   --   HEPARINUNFRC  --   --   --   --  >1.10*  CREATININE 0.72  --   --  0.62  --   TROPONINIHS 190*  --  172* 117*  --     Estimated Creatinine Clearance: 101.2 mL/min (by C-G formula based on SCr of 0.62 mg/dL).   Assessment: CC/HPI: 45 yo F with hx DVT no longer on anticoagulation presents with SOB, dizziness, heart palpitations.  Risks include travel to [place] 8 wks ago. CTA + extensive bilateral PE with saddle embolus with evidence of R heart strain. Pharmacy consulted to dose IV heparin.   01/27/2021:  Initial heparin level >1.1 on heparin infusion at 1400 units/hr  CBC: Hg slightly low at 11, pltc WNL  RN reports no bleeding or infusion issues.  She confirmed lab drawn peripherally from arm opposite heparin infusion.    Goal of Therapy:  Heparin level 0.3-0.7 units/ml Monitor platelets by anticoagulation protocol: Yes   Plan:  Hold heparin x1 hour then resume heparin infusion at 1100 units/hr Recheck heparin 6h after rate change Check heparin level and CBC daily while on heparin F/U IR recommendations  01/29/2021 PharmD, BCPS 01/27/2021,4:47 AM

## 2021-01-27 NOTE — Progress Notes (Signed)
Bilateral lower extremity venous duplex has been completed. Preliminary results can be found in CV Proc through chart review.   01/27/21 9:33 AM Olen Cordial RVT

## 2021-01-28 ENCOUNTER — Inpatient Hospital Stay (HOSPITAL_COMMUNITY): Payer: 59

## 2021-01-28 DIAGNOSIS — I2602 Saddle embolus of pulmonary artery with acute cor pulmonale: Secondary | ICD-10-CM | POA: Diagnosis not present

## 2021-01-28 LAB — FIBRINOGEN
Fibrinogen: 312 mg/dL (ref 210–475)
Fibrinogen: 322 mg/dL (ref 210–475)
Fibrinogen: 360 mg/dL (ref 210–475)

## 2021-01-28 LAB — CBC
HCT: 36.3 % (ref 36.0–46.0)
HCT: 35.1 % — ABNORMAL LOW (ref 36.0–46.0)
Hemoglobin: 12 g/dL (ref 12.0–15.0)
Hemoglobin: 11.5 g/dL — ABNORMAL LOW (ref 12.0–15.0)
MCH: 27.7 pg (ref 26.0–34.0)
MCH: 28 pg (ref 26.0–34.0)
MCHC: 33.1 g/dL (ref 30.0–36.0)
MCHC: 32.8 g/dL (ref 30.0–36.0)
MCV: 83.8 fL (ref 80.0–100.0)
MCV: 85.6 fL (ref 80.0–100.0)
Platelets: 128 10*3/uL — ABNORMAL LOW (ref 150–400)
Platelets: 134 10*3/uL — ABNORMAL LOW (ref 150–400)
RBC: 4.33 MIL/uL (ref 3.87–5.11)
RBC: 4.1 MIL/uL (ref 3.87–5.11)
RDW: 13.5 % (ref 11.5–15.5)
RDW: 13.7 % (ref 11.5–15.5)
WBC: 6.8 10*3/uL (ref 4.0–10.5)
WBC: 6.7 10*3/uL (ref 4.0–10.5)
nRBC: 0 % (ref 0.0–0.2)
nRBC: 0 % (ref 0.0–0.2)

## 2021-01-28 LAB — BASIC METABOLIC PANEL
Anion gap: 9 (ref 5–15)
BUN: 8 mg/dL (ref 6–20)
CO2: 18 mmol/L — ABNORMAL LOW (ref 22–32)
Calcium: 8.7 mg/dL — ABNORMAL LOW (ref 8.9–10.3)
Chloride: 112 mmol/L — ABNORMAL HIGH (ref 98–111)
Creatinine, Ser: 0.81 mg/dL (ref 0.44–1.00)
GFR, Estimated: >60 mL/min (ref >60)
Glucose, Bld: 75 mg/dL (ref 70–99)
Potassium: 4.3 mmol/L (ref 3.5–5.1)
Sodium: 139 mmol/L (ref 135–145)

## 2021-01-28 LAB — HEPARIN LEVEL (UNFRACTIONATED)
Heparin Unfractionated: 0.23 IU/mL — ABNORMAL LOW (ref 0.30–0.70)
Heparin Unfractionated: 0.33 IU/mL (ref 0.30–0.70)
Heparin Unfractionated: 0.39 IU/mL (ref 0.30–0.70)
Heparin Unfractionated: 0.4 IU/mL (ref 0.30–0.70)

## 2021-01-28 LAB — MAGNESIUM: Magnesium: 1.9 mg/dL (ref 1.7–2.4)

## 2021-01-28 LAB — GLUCOSE, CAPILLARY: Glucose-Capillary: 76 mg/dL (ref 70–99)

## 2021-01-28 MED ORDER — HEPARIN (PORCINE) 25000 UT/250ML-% IV SOLN
1100.0000 [IU]/h | INTRAVENOUS | Status: DC
  Administered 2021-01-28: 1100 [IU]/h via INTRAVENOUS
  Filled 2021-01-28: qty 250

## 2021-01-28 MED ORDER — MAGNESIUM SULFATE 2 GM/50ML IV SOLN
2.0000 g | Freq: Once | INTRAVENOUS | Status: AC
  Administered 2021-01-28: 2 g via INTRAVENOUS
  Filled 2021-01-28: qty 50

## 2021-01-28 NOTE — Progress Notes (Signed)
ANTICOAGULATION CONSULT NOTE - Follow Up Consult  Pharmacy Consult for Heparin Indication: pulmonary embolus  No Known Allergies  Patient Measurements: Height: 5\' 3"  (160 cm) Weight: 99.9 kg (220 lb 3.8 oz) IBW/kg (Calculated) : 52.4 Heparin Dosing Weight: 77 kg  Vital Signs: Temp: 98.2 F (36.8 C) (05/28 1857) Temp Source: Oral (05/28 1857) BP: 111/83 (05/28 1800) Pulse Rate: 89 (05/28 1800)  Labs: Recent Labs    01/26/21 2016 01/26/21 2150 01/26/21 2215 01/27/21 0302 01/27/21 0418 01/27/21 2342 01/28/21 0556 01/28/21 1140 01/28/21 1845  HGB 12.8  --   --  11.0*   < > 12.5 12.0 11.5*  --   HCT 36.8  --   --  33.6*   < > 38.8 36.3 35.1*  --   PLT 207  --   --  168   < > 158 128* 134*  --   APTT  --  26  --   --   --   --   --   --   --   LABPROT  --  13.2  --   --   --   --   --   --   --   INR  --  1.0  --   --   --   --   --   --   --   HEPARINUNFRC  --   --   --   --    < > 0.40 0.33 0.23* 0.39  CREATININE 0.72  --   --  0.62  --   --  0.81  --   --   TROPONINIHS 190*  --  172* 117*  --   --   --   --   --    < > = values in this interval not displayed.    Estimated Creatinine Clearance: 99.9 mL/min (by C-G formula based on SCr of 0.81 mg/dL).   Medications:  Infusions:  . sodium chloride Stopped (01/28/21 1128)  . sodium chloride    . sodium chloride    . sodium chloride    . heparin 1,100 Units/hr (01/28/21 1600)    Assessment: 45 yo F with hx DVT no longer on anticoagulation presents with SOB, dizziness, heart palpitations.  Risks include travel to [place] 8 wks ago.  CTA + extensive bilateral PE with saddle emboluswithevidence of R heart strain. Pharmacy consulted to dose IV heparin.   Significant Events: - 5/27 ~17:00: catheter directed thrombolysis with TPA 1mg /hr x 12hr x 2 pulmonary catheters.  Infuse x 12 hours, plan to remove catheters on 5/28  Today, 01/28/2021:  Heparin level now therapeutic on current IV heparin rate of 1100  units/hr  CBC:  Hgb decreased to 11.5, Plt decreased to 134  No IV site complications reported.   Pt currently in menses with heavier than normal flow, but no clots or severe bleeding.  No other sites of bleeding or bruising.    Catheter sites without pain, bleeding, or reported complications.    Goal of Therapy:  Heparin level 0.3-0.7 units/ml Monitor platelets by anticoagulation protocol: Yes   Plan:   Continue heparin IV infusion at 1100 units/hr  Recheck heparin level in 6 hours to confirm continued goal level at current rate  Daily heparin level and CBC  Follow up plans to transition to oral anticoagulant on 5/29   01/30/2021, PharmD, BCPS 01/28/2021 7:41 PM

## 2021-01-28 NOTE — Progress Notes (Signed)
ANTICOAGULATION CONSULT NOTE - Follow Up Consult  Pharmacy Consult for Heparin Indication: pulmonary embolus  No Known Allergies  Patient Measurements: Height: 5\' 3"  (160 cm) Weight: 99.9 kg (220 lb 3.8 oz) IBW/kg (Calculated) : 52.4 Heparin Dosing Weight: 77 kg  Vital Signs: Temp: 98.2 F (36.8 C) (05/27 2000) Temp Source: Oral (05/27 2000) BP: 102/81 (05/27 2300) Pulse Rate: 91 (05/27 2300)  Labs: Recent Labs    01/26/21 2016 01/26/21 2150 01/26/21 2215 01/27/21 0302 01/27/21 0418 01/27/21 1145 01/27/21 1803 01/27/21 2342  HGB 12.8  --   --  11.0*  --   --  11.2* 12.5  HCT 36.8  --   --  33.6*  --   --  34.6* 38.8  PLT 207  --   --  168  --   --  155 158  APTT  --  26  --   --   --   --   --   --   LABPROT  --  13.2  --   --   --   --   --   --   INR  --  1.0  --   --   --   --   --   --   HEPARINUNFRC  --   --   --   --    < > 0.77* 0.45 0.40  CREATININE 0.72  --   --  0.62  --   --   --   --   TROPONINIHS 190*  --  172* 117*  --   --   --   --    < > = values in this interval not displayed.    Estimated Creatinine Clearance: 101.2 mL/min (by C-G formula based on SCr of 0.62 mg/dL).   Medications:  Infusions:  . sodium chloride Stopped (01/27/21 0517)  . sodium chloride    . sodium chloride    . sodium chloride    . alteplase (tPA/ ACTIVASE) PE Lysis 12 mg/250 mL (BILATERAL)    . alteplase (tPA/ ACTIVASE) PE Lysis 12 mg/250 mL (BILATERAL)    . heparin 950 Units/hr (01/27/21 1800)    Assessment: 45 yo F with hx DVT no longer on anticoagulation presents with SOB, dizziness, heart palpitations.  Risks include travel to [place] 8 wks ago.  CTA + extensive bilateral PE with saddle emboluswithevidence of R heart strain. Pharmacy consulted to dose IV heparin.   Significant Events: - 5/27: catheter directed thrombolysis with TPA 1mg /hr x 12hr x 2 pulmonary catheters  Today, 01/28/2021: 2342 Heparin level 0.4 (therapeutic) on 950 units/hr CBC:  Hgb stable  12.5, Plt 158 No bleeding or complications reported.   Goal of Therapy:  Heparin level 0.3-0.7 units/ml Monitor platelets by anticoagulation protocol: Yes   Plan:  Continue heparin IV infusion at 950 units/hr Recheck Heparin level q6h x 4 post-procedure Daily heparin level and CBC Continue to monitor H&H and platelets   01/30/2021 RPh 01/28/2021, 12:17 AM

## 2021-01-28 NOTE — Progress Notes (Signed)
NAME:  Annette Lewis, MRN:  [id number], DOB:  1976-06-22, LOS: 1 ADMISSION DATE:  01/26/2021, CONSULTATION DATE: 01/26/21 REFERRING MD:  Silverio Lay  CHIEF COMPLAINT:  Pulmonary Emboli  History of Present Illness:  45 year old female with PMHx significant for DVT who presented to [facility] ED 5/26 with palpitations and SOB. Experienced a near-syncopal episode with SOB 5/25AM and felt this was related to low blood sugar, drank orange juice which seemed to help lightheadedness but continued to have palpitations and SOB.  Patient reports that she had a DVT several years ago deemed 2/2 frequent travel, not on anticoagulation at present. Traveled to [place] ~8 weeks ago, no other recent travel. No prior history of PE, tobacco use, cancer or exogenous estrogen use. She notes she only take vitamins. No personal history of cancer. Did have a DVT several years ago that was attributed to stasis. Reports SOB with any walking/exertion. Noted on Wednesday that it was acutely worse.Noted she could not walk up the steps to her hourse that she can usually do easily. No cough or fevers. No congestion. Denies fever/chills, CP, n/v/d or abdominal pain.   CTA Chest 5/26 demonstrated submassive PE with e/o R heart strain (ratio 1.2). Labs demonstrated BNP 205 and troponin 190.  Transferred to [facility] ICU for PCCM admission.  Pertinent  Medical History  DVT  Significant Hospital Events: Including procedures, antibiotic start and stop dates in addition to other pertinent events   . 5/26 admitted . 5/27 ECHO showed severe RH failure, IR placed catheter directed TPA therapy  Interim History / Subjective:   No acute events overnight. IR placed catheters for direct TPA therapy. Blood counts are stable this morning. No complaints of pain at catheter sites.   Objective   Blood pressure (!) 94/57, pulse 94, temperature 98.3 F (36.8 C), temperature source Oral, resp. rate 18, height 5\' 3"  (1.6 m), weight 99.9 kg, last menstrual  period 01/24/2021, SpO2 95 %.        Intake/Output Summary (Last 24 hours) at 01/28/2021 0724 Last data filed at 01/27/2021 2300 Gross per 24 hour  Intake 515.51 ml  Output --  Net 515.51 ml   Filed Weights   01/26/21 2147 01/27/21 0200  Weight: 105 kg 99.9 kg    Examination: General: no acute distress, resting in bed HENT: [place]/AT, moist mucous membranes, sclera anicteric Lungs: clear to auscultation bilaterally.  No wheezing or rhonchi. Cardiovascular: Regular rate and rhythm.  No murmurs. Abdomen: Soft, nontender, nondistended, bowel sounds present Extremities: Warm, no edema Neuro: Alert and oriented x3, moving all extremities. GU: Deferred  Labs/imaging that I have personally reviewed  (right click and "Reselect all SmartList Selections" daily)  CTA Chest 5/26 Extensive bilateral PE including large saddle embolus. Positive for acute PE with CT evidence of right heart strain (RV/LV Ratio = 1.2) consistent with at least submassive (intermediate risk) PE  Echo 5/27: Severe RV dilation and moderately reduced function.  LVEF 60 to 65%.  Flattening of the interventricular septum in systole and diastole.  Lower extremity ultrasound 5/27: No evidence of DVT in the left or right lower extremity.  BNP 205 Troponin 190 > 172 > 117  Cr 0.72 . 0.82  Resolved Hospital Problem list     Assessment & Plan:  Acute saddle pulmonary embolus History of DVT Admitted with SOB. CTA demonstrating extensive bilateral PE with saddle embolus with evidence of R heart strain (RV/LV Ratio 1.2). PESI Score 74 (class II, low risk). ECHO showed severe RV dilation and moderate  reduction in function which prompted her to go for catheter directed therapy with Interventional radiology on 5/27. - Therapeutic anticoagulation with heparin - Plan for catheters to removed by IR today - Will monitor closely for another 24 hours in the ICU - We will plan to transition her to eliquis or xarelto therapy  tomorrow  Best practice (right click and "Reselect all SmartList Selections" daily)  Diet:  Oral Pain/Anxiety/Delirium protocol (if indicated): No VAP protocol (if indicated): Not indicated DVT prophylaxis: Systemic AC GI prophylaxis: N/A Glucose control:  SSI No Central venous access:  N/A Arterial line:  N/A Foley:  N/A Mobility:  bed rest  PT consulted: N/A Last date of multidisciplinary goals of care discussion [updated patient and her husband 5/28 at bedside] Code Status:  full code Disposition: ICU  Labs   CBC: Recent Labs  Lab 01/26/21 2016 01/27/21 0302 01/27/21 1803 01/27/21 2342 01/28/21 0556  WBC 6.9 6.3 5.7 6.1 6.8  NEUTROABS 4.5  --   --   --   --   HGB 12.8 11.0* 11.2* 12.5 12.0  HCT 36.8 33.6* 34.6* 38.8 36.3  MCV 81.2 83.8 85.2 85.5 83.8  PLT 207 168 155 158 128*    Basic Metabolic Panel: Recent Labs  Lab 01/26/21 2016 01/27/21 0302 01/28/21 0556  NA 138 138 139  K 3.4* 3.3* 4.3  CL 106 108 112*  CO2 21* 17* 18*  GLUCOSE 102* 94 75  BUN 14 12 8   CREATININE 0.72 0.62 0.81  CALCIUM 8.9 8.7* 8.7*  MG 1.9 1.7  --   PHOS  --  3.5  --    GFR: Estimated Creatinine Clearance: 99.9 mL/min (by C-G formula based on SCr of 0.81 mg/dL). Recent Labs  Lab 01/27/21 0302 01/27/21 1803 01/27/21 2342 01/28/21 0556  WBC 6.3 5.7 6.1 6.8    Liver Function Tests: Recent Labs  Lab 01/26/21 2016  AST 23  ALT 17  ALKPHOS 53  BILITOT 0.3  PROT 7.0  ALBUMIN 3.7   Recent Labs  Lab 01/26/21 2016  LIPASE 31   No results for input(s): AMMONIA in the last 168 hours.  ABG No results found for: PHART, PCO2ART, PO2ART, HCO3, TCO2, ACIDBASEDEF, O2SAT   Coagulation Profile: Recent Labs  Lab 01/26/21 2150  INR 1.0    Cardiac Enzymes: No results for input(s): CKTOTAL, CKMB, CKMBINDEX, TROPONINI in the last 168 hours.  HbA1C: No results found for: HGBA1C  CBG: No results for input(s): GLUCAP in the last 168 hours.     Critical care time: 35  minutes    2151, MD [facility] Office: [phone]   See Amion for personal pager PCCM on call pager [phone] until 7pm. Please call Elink 7p-7a. [phone]

## 2021-01-28 NOTE — Progress Notes (Signed)
ANTICOAGULATION CONSULT NOTE - Follow Up Consult  Pharmacy Consult for Heparin Indication: pulmonary embolus  No Known Allergies  Patient Measurements: Height: 5\' 3"  (160 cm) Weight: 99.9 kg (220 lb 3.8 oz) IBW/kg (Calculated) : 52.4 Heparin Dosing Weight: 77 kg  Vital Signs: Temp: 98.3 F (36.8 C) (05/28 0400) Temp Source: Oral (05/28 0400) BP: 94/57 (05/28 0400) Pulse Rate: 94 (05/28 0400)  Labs: Recent Labs    01/26/21 2016 01/26/21 2150 01/26/21 2215 01/27/21 0302 01/27/21 0418 01/27/21 1803 01/27/21 2342 01/28/21 0556  HGB 12.8  --   --  11.0*  --  11.2* 12.5  --   HCT 36.8  --   --  33.6*  --  34.6* 38.8  --   PLT 207  --   --  168  --  155 158  --   APTT  --  26  --   --   --   --   --   --   LABPROT  --  13.2  --   --   --   --   --   --   INR  --  1.0  --   --   --   --   --   --   HEPARINUNFRC  --   --   --   --    < > 0.45 0.40 0.33  CREATININE 0.72  --   --  0.62  --   --   --   --   TROPONINIHS 190*  --  172* 117*  --   --   --   --    < > = values in this interval not displayed.    Estimated Creatinine Clearance: 101.2 mL/min (by C-G formula based on SCr of 0.62 mg/dL).   Medications:  Infusions:  . sodium chloride Stopped (01/27/21 0517)  . sodium chloride    . sodium chloride    . sodium chloride    . heparin 950 Units/hr (01/27/21 1800)    Assessment: 45 yo F with hx DVT no longer on anticoagulation presents with SOB, dizziness, heart palpitations.  Risks include travel to [place] 8 wks ago.  CTA + extensive bilateral PE with saddle emboluswithevidence of R heart strain. Pharmacy consulted to dose IV heparin.   Significant Events: - 5/27: catheter directed thrombolysis with TPA 1mg /hr x 12hr x 2 pulmonary catheters  Today, 01/28/2021: 2342 Heparin level 0.4 (therapeutic) on 950 units/hr CBC:  Hgb stable 12.5, Plt 158 No bleeding or complications reported.  0556 HL 0.33 therapeutic on 950 units/hr  Goal of Therapy:  Heparin level  0.3-0.7 units/ml Monitor platelets by anticoagulation protocol: Yes   Plan:  Continue heparin IV infusion at 950 units/hr Recheck Heparin level q6h x 4 post-procedure Daily heparin level and CBC Continue to monitor H&H and platelets   01/30/2021 RPh 01/28/2021, 6:26 AM

## 2021-01-28 NOTE — Progress Notes (Signed)
Patient tolerated removal of femoral sheath by Dr.Watts. Per MD patient will remain flat till 1500 and can then move to a semi-fowlers position.   Vascular/Neuro assessed and all are WNL.   Will continue to monitor

## 2021-01-28 NOTE — Progress Notes (Signed)
ANTICOAGULATION CONSULT NOTE - Follow Up Consult  Pharmacy Consult for Heparin Indication: pulmonary embolus  No Known Allergies  Patient Measurements: Height: 5\' 3"  (160 cm) Weight: 99.9 kg (220 lb 3.8 oz) IBW/kg (Calculated) : 52.4 Heparin Dosing Weight: 77 kg  Vital Signs: Temp: 98.3 F (36.8 C) (05/28 0400) Temp Source: Oral (05/28 0400) BP: 94/57 (05/28 0400) Pulse Rate: 94 (05/28 0400)  Labs: Recent Labs    01/26/21 2016 01/26/21 2150 01/26/21 2215 01/27/21 0302 01/27/21 0418 01/27/21 1803 01/27/21 2342 01/28/21 0556  HGB 12.8  --   --  11.0*  --  11.2* 12.5 12.0  HCT 36.8  --   --  33.6*  --  34.6* 38.8 36.3  PLT 207  --   --  168  --  155 158 128*  APTT  --  26  --   --   --   --   --   --   LABPROT  --  13.2  --   --   --   --   --   --   INR  --  1.0  --   --   --   --   --   --   HEPARINUNFRC  --   --   --   --    < > 0.45 0.40 0.33  CREATININE 0.72  --   --  0.62  --   --   --  0.81  TROPONINIHS 190*  --  172* 117*  --   --   --   --    < > = values in this interval not displayed.    Estimated Creatinine Clearance: 99.9 mL/min (by C-G formula based on SCr of 0.81 mg/dL).   Medications:  Infusions:  . sodium chloride Stopped (01/27/21 0517)  . sodium chloride    . sodium chloride    . sodium chloride    . heparin 950 Units/hr (01/27/21 1800)    Assessment: 44 yo F with hx DVT no longer on anticoagulation presents with SOB, dizziness, heart palpitations.  Risks include travel to [place] 8 wks ago.  CTA + extensive bilateral PE with saddle emboluswithevidence of R heart strain. Pharmacy consulted to dose IV heparin.   Significant Events: - 5/27 ~17:00: catheter directed thrombolysis with TPA 1mg /hr x 12hr x 2 pulmonary catheters.  Infuse x 12 hours, plan to remove catheters on 5/28  Today, 01/28/2021: Heparin level decreased to 0.23, subtherapeutic on Heparin 950 units/hr CBC:  Hgb decreased to 11.5, Plt decreased to 134 No IV site  complications reported.  Pt currently in menses with heavier than normal flow, but no clots or severe bleeding.  No other sites of bleeding or bruising.   Catheter sites without pain, bleeding, or reported complications.    Goal of Therapy:  Heparin level 0.3-0.7 units/ml Monitor platelets by anticoagulation protocol: Yes   Plan:  Increase to heparin IV infusion at 1100 units/hr Recheck Heparin level in 6 hours.   Daily heparin level and CBC Follow up plans to transition to oral anticoagulant on 5/29  01/30/2021 PharmD, BCPS Clinical Pharmacist [facility] main pharmacy [phone] 01/28/2021 9:03 AM

## 2021-01-28 NOTE — Progress Notes (Signed)
Patient moved to a semi-fowlers position per MD Watts order. Post-sheath site appears clean, dry, and intact. No signs of bleeding from site. Patient not complaining of any pain. Vitals are stable and WNL.   Will continue to monitor

## 2021-01-28 NOTE — Progress Notes (Signed)
Patient requested to ambulate to bathroom. MD Dewald notified and approved for minimal ambulation in room as long as patient remains stable, not presenting with dizziness, no pain in right extremity, and vital signs stable.   Patient ambulated safely to bathroom and then to recliner to eat her lunch without any complaints of dizziness, lightheadedness, or pain.  Post-sheath sight remains clean, dry, and intact. No signs of bleed have been observed from post-sheath site.    Will continue to monitor

## 2021-01-29 ENCOUNTER — Telehealth: Payer: Self-pay | Admitting: Pulmonary Disease

## 2021-01-29 DIAGNOSIS — I2601 Septic pulmonary embolism with acute cor pulmonale: Secondary | ICD-10-CM

## 2021-01-29 LAB — CBC
HCT: 32.4 % — ABNORMAL LOW (ref 36.0–46.0)
Hemoglobin: 10.5 g/dL — ABNORMAL LOW (ref 12.0–15.0)
MCH: 28 pg (ref 26.0–34.0)
MCHC: 32.4 g/dL (ref 30.0–36.0)
MCV: 86.4 fL (ref 80.0–100.0)
Platelets: 129 10*3/uL — ABNORMAL LOW (ref 150–400)
RBC: 3.75 MIL/uL — ABNORMAL LOW (ref 3.87–5.11)
RDW: 13.6 % (ref 11.5–15.5)
WBC: 5.7 10*3/uL (ref 4.0–10.5)
nRBC: 0 % (ref 0.0–0.2)

## 2021-01-29 LAB — BASIC METABOLIC PANEL
Anion gap: 7 (ref 5–15)
BUN: 11 mg/dL (ref 6–20)
CO2: 22 mmol/L (ref 22–32)
Calcium: 8.5 mg/dL — ABNORMAL LOW (ref 8.9–10.3)
Chloride: 110 mmol/L (ref 98–111)
Creatinine, Ser: 0.63 mg/dL (ref 0.44–1.00)
GFR, Estimated: >60 mL/min (ref >60)
Glucose, Bld: 88 mg/dL (ref 70–99)
Potassium: 3.6 mmol/L (ref 3.5–5.1)
Sodium: 139 mmol/L (ref 135–145)

## 2021-01-29 LAB — HEPARIN LEVEL (UNFRACTIONATED): Heparin Unfractionated: 0.43 IU/mL (ref 0.30–0.70)

## 2021-01-29 MED ORDER — APIXABAN 5 MG PO TABS
10.0000 mg | ORAL_TABLET | Freq: Two times a day (BID) | ORAL | Status: DC
  Administered 2021-01-29 – 2021-01-30 (×3): 10 mg via ORAL
  Filled 2021-01-29 (×3): qty 2

## 2021-01-29 MED ORDER — LIP MEDEX EX OINT: TOPICAL_OINTMENT | CUTANEOUS | Status: DC | PRN

## 2021-01-29 MED ORDER — POTASSIUM CHLORIDE CRYS ER 20 MEQ PO TBCR
20.0000 meq | EXTENDED_RELEASE_TABLET | Freq: Every day | ORAL | Status: DC
  Administered 2021-01-30: 20 meq via ORAL
  Filled 2021-01-29: qty 1

## 2021-01-29 MED ORDER — APIXABAN 5 MG PO TABS: 5.0000 mg | ORAL_TABLET | Freq: Two times a day (BID) | ORAL | Status: DC

## 2021-01-29 NOTE — Progress Notes (Signed)
ANTICOAGULATION CONSULT NOTE - Follow Up Consult  Pharmacy Consult for Heparin Indication: pulmonary embolus  No Known Allergies  Patient Measurements: Height: 5\' 3"  (160 cm) Weight: 99.9 kg (220 lb 3.8 oz) IBW/kg (Calculated) : 52.4 Heparin Dosing Weight: 77 kg  Vital Signs: Temp: 97.9 F (36.6 C) (05/29 0024) Temp Source: Oral (05/29 0024) BP: 118/80 (05/29 0200) Pulse Rate: 86 (05/29 0200)  Labs: Recent Labs    01/26/21 2016 01/26/21 2150 01/26/21 2215 01/27/21 0302 01/27/21 0418 01/28/21 0556 01/28/21 1140 01/28/21 1845 01/29/21 0233  HGB 12.8  --   --  11.0*   < > 12.0 11.5*  --  10.5*  HCT 36.8  --   --  33.6*   < > 36.3 35.1*  --  32.4*  PLT 207  --   --  168   < > 128* 134*  --  129*  APTT  --  26  --   --   --   --   --   --   --   LABPROT  --  13.2  --   --   --   --   --   --   --   INR  --  1.0  --   --   --   --   --   --   --   HEPARINUNFRC  --   --   --   --    < > 0.33 0.23* 0.39 0.43  CREATININE 0.72  --   --  0.62  --  0.81  --   --   --   TROPONINIHS 190*  --  172* 117*  --   --   --   --   --    < > = values in this interval not displayed.    Estimated Creatinine Clearance: 99.9 mL/min (by C-G formula based on SCr of 0.81 mg/dL).   Medications:  Infusions:  . sodium chloride Stopped (01/28/21 1128)  . sodium chloride    . sodium chloride    . sodium chloride    . heparin 1,100 Units/hr (01/29/21 0200)    Assessment: 45 yo F with hx DVT no longer on anticoagulation presents with SOB, dizziness, heart palpitations.  Risks include travel to [place] 8 wks ago.  CTA + extensive bilateral PE with saddle emboluswithevidence of R heart strain. Pharmacy consulted to dose IV heparin.   Significant Events: - 5/27 ~17:00: catheter directed thrombolysis with TPA 1mg /hr x 12hr x 2 pulmonary catheters.  Infuse x 12 hours, plan to remove catheters on 5/28  Today, 01/29/2021:  Confirmatory Heparin level therapeutic on current IV heparin rate of 1100  units/hr  CBC:  Hgb decreased to 10.5, Plt decreased to 129  No bleeding or other complications per RN    Goal of Therapy:  Heparin level 0.3-0.7 units/ml Monitor platelets by anticoagulation protocol: Yes   Plan:   Continue heparin IV infusion at 1100 units/hr  Daily heparin level and CBC  Follow up plans to transition to oral anticoagulant on 5/29   01/31/2021 RPh 01/29/2021, 3:07 AM

## 2021-01-29 NOTE — Discharge Instructions (Signed)
Information on my medicine - ELIQUIS (apixaban)  This medication education was reviewed with me or my healthcare representative as part of my discharge preparation.  The pharmacist that spoke with me during my hospital stay was:   Wynona Canes  Why was Eliquis prescribed for you? Eliquis was prescribed to treat blood clots that may have been found in the veins of your legs (deep vein thrombosis) or in your lungs (pulmonary embolism) and to reduce the risk of them occurring again.  What do You need to know about Eliquis ? The starting dose is 10 mg (two 5 mg tablets) taken TWICE daily for the FIRST SEVEN (7) DAYS, then on (enter date)  02/05/21  the dose is reduced to ONE 5 mg tablet taken TWICE daily.  Eliquis may be taken with or without food.   Try to take the dose about the same time in the morning and in the evening. If you have difficulty swallowing the tablet whole please discuss with your pharmacist how to take the medication safely.  Take Eliquis exactly as prescribed and DO NOT stop taking Eliquis without talking to the doctor who prescribed the medication.  Stopping may increase your risk of developing a new blood clot.  Refill your prescription before you run out.  After discharge, you should have regular check-up appointments with your healthcare provider that is prescribing your Eliquis.    What do you do if you miss a dose? If a dose of ELIQUIS is not taken at the scheduled time, take it as soon as possible on the same day and twice-daily administration should be resumed. The dose should not be doubled to make up for a missed dose.  Important Safety Information A possible side effect of Eliquis is bleeding. You should call your healthcare provider right away if you experience any of the following: ? Bleeding from an injury or your nose that does not stop. ? Unusual colored urine (red or dark brown) or unusual colored stools (red or black). ? Unusual bruising for unknown  reasons. ? A serious fall or if you hit your head (even if there is no bleeding).  Some medicines may interact with Eliquis and might increase your risk of bleeding or clotting while on Eliquis. To help avoid this, consult your healthcare provider or pharmacist prior to using any new prescription or non-prescription medications, including herbals, vitamins, non-steroidal anti-inflammatory drugs (NSAIDs) and supplements.  This website has more information on Eliquis (apixaban): http://www.eliquis.com/eliquis/home

## 2021-01-29 NOTE — Progress Notes (Signed)
PROGRESS NOTE    Annette Lewis  ZOX:[id number] DOB: [birth year] DOA: 01/26/2021 PCP: Joycelyn RuaMeyers, Stephen, MD    No chief complaint on file.   Brief Narrative:  45 year old female with PMHx significant for DVT who [facility] ED 5/26 with palpitations and SOB. Experienced a near-syncopal episode with SOB 5/25AM and felt this was related to low blood sugar, drank orange juice which seemed to help lightheadedness but continued to have palpitations and SOB.  Patient reports that she had a DVT several years ago deemed 2/2 frequent travel, not on anticoagulation at present. Traveled to [place] ~8 weeks ago, no other recent travel. No prior history of PE, tobacco use, cancer or exogenous estrogen use. She notes she only take vitamins.   Subjective:  She is sitting up on edge of the bed, denies chest pain, no cough, no hypoxia, she is on room air Husband at bedside  Assessment & Plan:   Active Problems:   Pulmonary emboli (HCC)   Pulmonary embolus (HCC)   Pulmonary embolism (HCC)  Acute saddle pulmonary embolus with right heart strain With prior history of DVT thought due to travel, was on Xarelto for a few months back then s/pcatheter directed therapy with Interventional radiology on 5/27 Has been on heparin drip, improved, she is transferred furred out off ICU, transition to oral today  Anemia/thrombocytopenia -Likely related to PE -Need to follow-up with PCP, repeat labs  Hypokalemia Replace K/check mag   Body mass index is 39.01 kg/m.Marland Kitchen.       Unresulted Labs (From admission, onward)          Start     Ordered   01/30/21 0500  CBC  Tomorrow morning,   R       Question:  Specimen collection method  Answer:  Lab=Lab collect   01/29/21 1839   01/30/21 0500  Magnesium  Tomorrow morning,   R       Question:  Specimen collection method  Answer:  Lab=Lab collect   01/29/21 1839   01/27/21 0500  Basic metabolic panel  Daily,   R      01/27/21 0252            DVT  prophylaxis: SCDs Start: 01/27/21 0251 apixaban (ELIQUIS) tablet 10 mg  apixaban (ELIQUIS) tablet 5 mg   Code Status: Full Family Communication: Husband at bedside Disposition:   Status is: Inpatient  Dispo: The patient is from: Home              Anticipated d/c is to: Home              Anticipated d/c date is: 5/30                Consultants:   PCCM  IR  Procedures:  catheter directed thrombolysis with Interventional radiology on 5/27  Antimicrobials:    Anti-infectives (From admission, onward)   None         Objective: Vitals:   01/29/21 1200 01/29/21 1400 01/29/21 1500 01/29/21 1600  BP: (!) 114/101 119/70 (!) 119/95   Pulse: 78 92 82 92  Resp: 15 17 20 18   Temp: 98.1 F (36.7 C)   98 F (36.7 C)  TempSrc: Oral   Oral  SpO2: 100% 98% 100% 99%  Weight:      Height:        Intake/Output Summary (Last 24 hours) at 01/29/2021 1839 Last data filed at 01/29/2021 1257 Gross per 24 hour  Intake 1620.13 ml  Output  1425 ml  Net 195.13 ml   Filed Weights   01/26/21 2147 01/27/21 0200  Weight: 105 kg 99.9 kg    Examination:  General exam: calm, NAD Respiratory system: Clear to auscultation. Respiratory effort normal. Cardiovascular system: S1 & S2 heard, RRR. No JVD, no murmur, No pedal edema. Gastrointestinal system: Abdomen is nondistended, soft and nontender.  Normal bowel sounds heard. Central nervous system: Alert and oriented. No focal neurological deficits. Extremities: Symmetric 5 x 5 power. Skin: No rashes, lesions or ulcers Psychiatry: Judgement and insight appear normal. Mood & affect appropriate.     Data Reviewed: I have personally reviewed following labs and imaging studies  CBC: Recent Labs  Lab 01/26/21 2016 01/27/21 0302 01/27/21 1803 01/27/21 2342 01/28/21 0556 01/28/21 1140 01/29/21 0233  WBC 6.9   < > 5.7 6.1 6.8 6.7 5.7  NEUTROABS 4.5  --   --   --   --   --   --   HGB 12.8   < > 11.2* 12.5 12.0 11.5* 10.5*  HCT 36.8    < > 34.6* 38.8 36.3 35.1* 32.4*  MCV 81.2   < > 85.2 85.5 83.8 85.6 86.4  PLT 207   < > 155 158 128* 134* 129*   < > = values in this interval not displayed.    Basic Metabolic Panel: Recent Labs  Lab 01/26/21 2016 01/27/21 0302 01/28/21 0556 01/29/21 0233  NA 138 138 139 139  K 3.4* 3.3* 4.3 3.6  CL 106 108 112* 110  CO2 21* 17* 18* 22  GLUCOSE 102* 94 75 88  BUN 14 12 8 11   CREATININE 0.72 0.62 0.81 0.63  CALCIUM 8.9 8.7* 8.7* 8.5*  MG 1.9 1.7 1.9  --   PHOS  --  3.5  --   --     GFR: Estimated Creatinine Clearance: 101.2 mL/min (by C-G formula based on SCr of 0.63 mg/dL).  Liver Function Tests: Recent Labs  Lab 01/26/21 2016  AST 23  ALT 17  ALKPHOS 53  BILITOT 0.3  PROT 7.0  ALBUMIN 3.7    CBG: Recent Labs  Lab 01/28/21 0846  GLUCAP 76     Recent Results (from the past 240 hour(s))  Resp Panel by RT-PCR (Flu A&B, Covid) Nasopharyngeal Swab     Status: None   Collection Time: 01/26/21  9:50 PM   Specimen: Nasopharyngeal Swab; Nasopharyngeal(NP) swabs in vial transport medium  Result Value Ref Range Status   SARS Coronavirus 2 by RT PCR NEGATIVE NEGATIVE Final    Comment: (NOTE) SARS-CoV-2 target nucleic acids are NOT DETECTED.  The SARS-CoV-2 RNA is generally detectable in upper respiratory specimens during the acute phase of infection. The lowest concentration of SARS-CoV-2 viral copies this assay can detect is 138 copies/mL. A negative result does not preclude SARS-Cov-2 infection and should not be used as the sole basis for treatment or other patient management decisions. A negative result may occur with  improper specimen collection/handling, submission of specimen other than nasopharyngeal swab, presence of viral mutation(s) within the areas targeted by this assay, and inadequate number of viral copies(<138 copies/mL). A negative result must be combined with clinical observations, patient history, and epidemiological information. The  expected result is Negative.  Fact Sheet for Patients:  01/28/21  Fact Sheet for Healthcare Providers:  BloggerCourse.com  This test is no t yet approved or cleared by the [place] FDA and  has been authorized for detection and/or diagnosis of SARS-CoV-2 by FDA under  an Emergency Use Authorization (EUA). This EUA will remain  in effect (meaning this test can be used) for the duration of the COVID-19 declaration under Section 564(b)(1) of the Act, 21 U.S.C.section 360bbb-3(b)(1), unless the authorization is terminated  or revoked sooner.       Influenza A by PCR NEGATIVE NEGATIVE Final   Influenza B by PCR NEGATIVE NEGATIVE Final    Comment: (NOTE) The Xpert Xpress SARS-CoV-2/FLU/RSV plus assay is intended as an aid in the diagnosis of influenza from Nasopharyngeal swab specimens and should not be used as a sole basis for treatment. Nasal washings and aspirates are unacceptable for Xpert Xpress SARS-CoV-2/FLU/RSV testing.  Fact Sheet for Patients: BloggerCourse.com  Fact Sheet for Healthcare Providers: SeriousBroker.it  This test is not yet approved or cleared by the [place] FDA and has been authorized for detection and/or diagnosis of SARS-CoV-2 by FDA under an Emergency Use Authorization (EUA). This EUA will remain in effect (meaning this test can be used) for the duration of the COVID-19 declaration under Section 564(b)(1) of the Act, 21 U.S.C. section 360bbb-3(b)(1), unless the authorization is terminated or revoked.  Performed at [facility], [address] [place], [place] [address]   MRSA PCR Screening     Status: None   Collection Time: 01/27/21  8:38 AM  Result Value Ref Range Status   MRSA by PCR NEGATIVE NEGATIVE Final    Comment:        The GeneXpert MRSA Assay (FDA approved for NASAL specimens only), is one component of  a comprehensive MRSA colonization surveillance program. It is not intended to diagnose MRSA infection nor to guide or monitor treatment for MRSA infections. Performed at [facility], [address], [place], [place] [address]          Radiology Studies: DG Chest Port 1 View  Result Date: 01/28/2021 CLINICAL DATA:  Catheter placement EXAM: PORTABLE CHEST 1 VIEW COMPARISON:  CTA chest dated 01/26/2021 FINDINGS: Pulmonary artery infusion catheters in the right and left main pulmonary arteries, satisfactory location without complication. Lungs are clear.  No pleural effusion or pneumothorax. The heart is normal in size. Enlargement of the main pulmonary artery, suggesting pulmonary arterial hypertension. IMPRESSION: Pulmonary artery infusion catheters in the right and left main pulmonary arteries. No evidence of acute cardiopulmonary disease. Electronically Signed   By: Charline Bills M.D.   On: 01/28/2021 09:32        Scheduled Meds: . apixaban  10 mg Oral BID   Followed by  . [START ON 02/05/2021] apixaban  5 mg Oral BID  . Chlorhexidine Gluconate Cloth  6 each Topical Daily  . [START ON 01/30/2021] potassium chloride  20 mEq Oral Daily  . sodium chloride flush  3 mL Intravenous Q12H   Continuous Infusions: . sodium chloride Stopped (01/28/21 1128)  . sodium chloride    . sodium chloride    . sodium chloride       LOS: 2 days   Time spent: Greater than 50% of this time was spent in counseling, explanation of diagnosis, planning of further management, and coordination of care.   Voice Recognition Reubin Milan dictation system was used to create this note, attempts have been made to correct errors. Please contact the author with questions and/or clarifications.   Albertine Grates, MD PhD FACP [facility]  Available via Epic secure chat 7am-7pm for nonurgent issues Please page for urgent issues To page the attending provider between 7A-7P or the  covering provider during after hours 7P-7A, please log into the web site [email or web address] and access using universal [place] password for that web site. If you do not have the password, please call the hospital operator.    01/29/2021, 6:39 PM

## 2021-01-29 NOTE — Telephone Encounter (Signed)
Please schedule patient for hospital follow up with me in 4-6 weeks for pulmonary emboli.   Thanks, Cletis Athens

## 2021-01-29 NOTE — Progress Notes (Signed)
NAME:  Annette Lewis, MRN:  [id number], DOB:  27-Jul-1976, LOS: 2 ADMISSION DATE:  01/26/2021, CONSULTATION DATE: 01/26/21 REFERRING MD:  Silverio Lay  CHIEF COMPLAINT:  Pulmonary Emboli  History of Present Illness:  45 year old female with PMHx significant for DVT who presented to [facility] ED 5/26 with palpitations and SOB. Experienced a near-syncopal episode with SOB 5/25AM and felt this was related to low blood sugar, drank orange juice which seemed to help lightheadedness but continued to have palpitations and SOB.  Patient reports that she had a DVT several years ago deemed 2/2 frequent travel, not on anticoagulation at present. Traveled to [place] ~8 weeks ago, no other recent travel. No prior history of PE, tobacco use, cancer or exogenous estrogen use. She notes she only take vitamins. No personal history of cancer. Did have a DVT several years ago that was attributed to stasis. Reports SOB with any walking/exertion. Noted on Wednesday that it was acutely worse.Noted she could not walk up the steps to her hourse that she can usually do easily. No cough or fevers. No congestion. Denies fever/chills, CP, n/v/d or abdominal pain.   CTA Chest 5/26 demonstrated submassive PE with e/o R heart strain (ratio 1.2). Labs demonstrated BNP 205 and troponin 190.  Transferred to [facility] ICU for PCCM admission.  Pertinent  Medical History  DVT  Significant Hospital Events: Including procedures, antibiotic start and stop dates in addition to other pertinent events   . 5/26 admitted . 5/27 ECHO showed severe RH failure, IR placed catheter directed TPA therapy . 5/28 TPA catheters removed  Interim History / Subjective:   No acute events overnight. TPA catheters removed yesterday. No pain or bleeding at insertion sites. Patient tolerating short walks to the bathroom. She is eating and drinking well.  Objective   Blood pressure 119/79, pulse 83, temperature 97.9 F (36.6 C), temperature source Oral, resp. rate 17,  height 5\' 3"  (1.6 m), weight 99.9 kg, last menstrual period 01/24/2021, SpO2 99 %.        Intake/Output Summary (Last 24 hours) at 01/29/2021 0951 Last data filed at 01/29/2021 0834 Gross per 24 hour  Intake 1077.14 ml  Output 1176 ml  Net -98.86 ml   Filed Weights   01/26/21 2147 01/27/21 0200  Weight: 105 kg 99.9 kg    Examination: General: no acute distress, resting in bed HENT: [place]/AT, moist mucous membranes, sclera anicteric Lungs: clear to auscultation bilaterally.  No wheezing or rhonchi. Cardiovascular: Regular rate and rhythm.  No murmurs. Abdomen: Soft, nontender, nondistended, bowel sounds present Extremities: Warm, no edema Neuro: Alert and oriented x3, moving all extremities. GU: Deferred  Labs/imaging that I have personally reviewed  (right click and "Reselect all SmartList Selections" daily)  CTA Chest 5/26 Extensive bilateral PE including large saddle embolus. Positive for acute PE with CT evidence of right heart strain (RV/LV Ratio = 1.2) consistent with at least submassive (intermediate risk) PE  Echo 5/27: Severe RV dilation and moderately reduced function.  LVEF 60 to 65%.  Flattening of the interventricular septum in systole and diastole.  5/28: CXR unremarkable with TPA catheters in good position.   Lower extremity ultrasound 5/27: No evidence of DVT in the left or right lower extremity.  BNP 205 Troponin 190 > 172 > 117  Cr 0.72 . 0.82  5/29 plts 129, Hgb 10.5g/dL  Resolved Hospital Problem list     Assessment & Plan:  Acute saddle pulmonary embolus History of DVT Admitted with SOB. CTA demonstrating extensive bilateral PE with  saddle embolus with evidence of R heart strain (RV/LV Ratio 1.2). PESI Score 74 (class II, low risk). ECHO showed severe RV dilation and moderate reduction in function which prompted her to go for catheter directed therapy with Interventional radiology on 5/27. -  Will transition from heparin drip to eliquis therapy  today - If doing well on eliquis today and overnight, then safe for discharge on 5/30 - She is safe for transfer to the progressive care unit. - Will schedule her follow up in the pulmonary clinic in 4-6 weeks. She will also need referral to hematology clinic for evaluation of clotting disorder.  Further management per primary hospitalist team. PCCM will sign off.   Best practice (right click and "Reselect all SmartList Selections" daily)  Diet:  Oral Pain/Anxiety/Delirium protocol (if indicated): No VAP protocol (if indicated): Not indicated DVT prophylaxis: Systemic AC GI prophylaxis: N/A Glucose control:  SSI No Central venous access:  N/A Arterial line:  N/A Foley:  N/A Mobility:  OOB  PT consulted: N/A Last date of multidisciplinary goals of care discussion [updated patient and her husband 5/29 at bedside] Code Status:  full code Disposition: Transfer to progressive care unit  Labs   CBC: Recent Labs  Lab 01/26/21 2016 01/27/21 0302 01/27/21 1803 01/27/21 2342 01/28/21 0556 01/28/21 1140 01/29/21 0233  WBC 6.9   < > 5.7 6.1 6.8 6.7 5.7  NEUTROABS 4.5  --   --   --   --   --   --   HGB 12.8   < > 11.2* 12.5 12.0 11.5* 10.5*  HCT 36.8   < > 34.6* 38.8 36.3 35.1* 32.4*  MCV 81.2   < > 85.2 85.5 83.8 85.6 86.4  PLT 207   < > 155 158 128* 134* 129*   < > = values in this interval not displayed.    Basic Metabolic Panel: Recent Labs  Lab 01/26/21 2016 01/27/21 0302 01/28/21 0556 01/29/21 0233  NA 138 138 139 139  K 3.4* 3.3* 4.3 3.6  CL 106 108 112* 110  CO2 21* 17* 18* 22  GLUCOSE 102* 94 75 88  BUN 14 12 8 11   CREATININE 0.72 0.62 0.81 0.63  CALCIUM 8.9 8.7* 8.7* 8.5*  MG 1.9 1.7 1.9  --   PHOS  --  3.5  --   --    GFR: Estimated Creatinine Clearance: 101.2 mL/min (by C-G formula based on SCr of 0.63 mg/dL). Recent Labs  Lab 01/27/21 2342 01/28/21 0556 01/28/21 1140 01/29/21 0233  WBC 6.1 6.8 6.7 5.7    Liver Function Tests: Recent Labs  Lab  01/26/21 2016  AST 23  ALT 17  ALKPHOS 53  BILITOT 0.3  PROT 7.0  ALBUMIN 3.7   Recent Labs  Lab 01/26/21 2016  LIPASE 31   No results for input(s): AMMONIA in the last 168 hours.  ABG No results found for: PHART, PCO2ART, PO2ART, HCO3, TCO2, ACIDBASEDEF, O2SAT   Coagulation Profile: Recent Labs  Lab 01/26/21 2150  INR 1.0    Cardiac Enzymes: No results for input(s): CKTOTAL, CKMB, CKMBINDEX, TROPONINI in the last 168 hours.  HbA1C: No results found for: HGBA1C  CBG: Recent Labs  Lab 01/28/21 0846  GLUCAP 76       Critical care time: n/a    01/30/21, MD [facility] Office: [phone]   See Amion for personal pager PCCM on call pager [phone] until 7pm. Please call Elink 7p-7a. [phone]

## 2021-01-30 DIAGNOSIS — I2601 Septic pulmonary embolism with acute cor pulmonale: Secondary | ICD-10-CM | POA: Diagnosis not present

## 2021-01-30 LAB — BASIC METABOLIC PANEL
Anion gap: 7 (ref 5–15)
BUN: 12 mg/dL (ref 6–20)
CO2: 24 mmol/L (ref 22–32)
Calcium: 8.9 mg/dL (ref 8.9–10.3)
Chloride: 110 mmol/L (ref 98–111)
Creatinine, Ser: 0.51 mg/dL (ref 0.44–1.00)
GFR, Estimated: >60 mL/min (ref >60)
Glucose, Bld: 93 mg/dL (ref 70–99)
Potassium: 3.9 mmol/L (ref 3.5–5.1)
Sodium: 141 mmol/L (ref 135–145)

## 2021-01-30 LAB — CBC
HCT: 33.6 % — ABNORMAL LOW (ref 36.0–46.0)
Hemoglobin: 10.8 g/dL — ABNORMAL LOW (ref 12.0–15.0)
MCH: 28 pg (ref 26.0–34.0)
MCHC: 32.1 g/dL (ref 30.0–36.0)
MCV: 87 fL (ref 80.0–100.0)
Platelets: 155 10*3/uL (ref 150–400)
RBC: 3.86 MIL/uL — ABNORMAL LOW (ref 3.87–5.11)
RDW: 13.6 % (ref 11.5–15.5)
WBC: 5 10*3/uL (ref 4.0–10.5)
nRBC: 0 % (ref 0.0–0.2)

## 2021-01-30 LAB — MAGNESIUM: Magnesium: 1.7 mg/dL (ref 1.7–2.4)

## 2021-01-30 MED ORDER — APIXABAN 5 MG PO TABS: 10.0000 mg | ORAL_TABLET | Freq: Two times a day (BID) | ORAL | 0 refills | Status: DC

## 2021-01-30 MED ORDER — APIXABAN 5 MG PO TABS: 5.0000 mg | ORAL_TABLET | Freq: Two times a day (BID) | ORAL | 0 refills | Status: DC

## 2021-01-30 NOTE — Plan of Care (Signed)
  Problem: Education: Goal: Knowledge of General Education information will improve Description: Including pain rating scale, medication(s)/side effects and non-pharmacologic comfort measures Outcome: Adequate for Discharge   Problem: Health Behavior/Discharge Planning: Goal: Ability to manage health-related needs will improve Outcome: Adequate for Discharge   Problem: Clinical Measurements: Goal: Ability to maintain clinical measurements within normal limits will improve Outcome: Adequate for Discharge   Problem: Elimination: Goal: Will not experience complications related to bowel motility Outcome: Adequate for Discharge   Problem: Skin Integrity: Goal: Risk for impaired skin integrity will decrease Outcome: Adequate for Discharge

## 2021-01-30 NOTE — Discharge Summary (Signed)
Physician Discharge Summary  Annette Lewis ZOX:[id number] DOB: April 08, 1976 DOA: 01/26/2021  PCP: Joycelyn Rua, MD  Admit date: 01/26/2021 Discharge date: 01/30/2021  Admitted From: Home Disposition:  Home  Discharge Condition:Stable CODE STATUS:FULL Diet recommendation:  Regular   Brief/Interim Summary:  Patient is a 45 year old female with PMHx significant for DVT who presented to [facility] ED 5/26 with palpitations and SOB.Patient reports that she had a DVT several years ago deemed 2/2 frequent travel, not on anticoagulation at present,was taking xarelto in the past.  Patient was found to have acute saddle pulmonary embolus with right heart strain.  She underwent catheter guided therapy by IR on 5/27.  She was admitted under PCCM service.  Currently she is hemodynamically stable.  Anticoagulation has been transitioned to Eliquis.  She is medically stable for discharge today.  She will follow-up with pulmonology in hematology as an outpatient.  Following problems were addressed during her hospitalization:   Acute saddle pulmonary embolus with right heart strain With prior history of DVT thought due to travel, was on Xarelto for a few months back then s/pcatheter directed therapy with Interventional radiology on 5/27 Has been on heparin drip, improved, she is transferred  out off ICU, transitioned to oral anticoagulation. Echo had shown severe RV dilation and moderately reduced function consistent with cor pulmonale in the acute setting of PE.  Left ventricular ejection fraction of 66 -65%, no wall motion abnormality. Hemodynamically stable, she ambulated on the hallway without any problems today. She needs to follow-up with pulmonology and hematology as an outpatient.  Anemia/thrombocytopenia -Likely related to PE -Now stable  Hypokalemia Supplemented and corrected  Morbid obesity:  Body mass index is 40.  Discharge Diagnoses:  Active Problems:   Pulmonary emboli (HCC)    Pulmonary embolus (HCC)   Pulmonary embolism [facility])    Discharge Instructions  Discharge Instructions    Diet general   Complete by: As directed    Discharge instructions   Complete by: As directed    1)Please take prescribed medications as instructed 2)Follow up with your PCP in a week. 3) Follow-up with pulmonology as an outpatient in 4 to 6 weeks.  Name and number of the provider has been attached. 4)You will be called by hematology for follow-up appointment.   Increase activity slowly   Complete by: As directed      Allergies as of 01/30/2021   No Known Allergies     Medication List    TAKE these medications   apixaban 5 MG Tabs tablet Commonly known as: ELIQUIS Take 2 tablets (10 mg total) by mouth 2 (two) times daily for 6 days.   apixaban 5 MG Tabs tablet Commonly known as: ELIQUIS Take 1 tablet (5 mg total) by mouth 2 (two) times daily. Start taking on: February 05, 2021   cholecalciferol 25 MCG (1000 UNIT) tablet Commonly known as: VITAMIN D3 Take 1,000 Units by mouth daily.   magnesium 30 MG tablet Take 30 mg by mouth 2 (two) times daily.   multivitamin tablet Take 1 tablet by mouth daily.   POTASSIUM CHLORIDE PO Take 15 mLs by mouth daily. Potassium salt.       Follow-up Information    Martina Sinner, MD Follow up.   Specialty: Pulmonary Disease Contact information: [address] [place] [address] [phone]        Joycelyn Rua, MD. Schedule an appointment as soon as possible for a visit in 1 week(s).   Specialty: Family Medicine Contact information:  [address] [place] [address] [phone]              No Known Allergies  Consultations:  PCCM,IR   Procedures/Studies: DG Chest 2 View  Result Date: 01/26/2021 CLINICAL DATA:  45 year old female with shortness of breath. EXAM: CHEST - 2 VIEW COMPARISON:  None. FINDINGS: Shallow inspiration. No focal consolidation, pleural effusion,  or pneumothorax. The cardiac silhouette is within limits. No acute osseous pathology. IMPRESSION: No active cardiopulmonary disease. Electronically Signed   By: Elgie CollardArash  Radparvar M.D.   On: 01/26/2021 20:56   CT Angio Chest PE W and/or Wo Contrast  Result Date: 01/26/2021 CLINICAL DATA:  Short of breath and dizzy EXAM: CT ANGIOGRAPHY CHEST WITH CONTRAST TECHNIQUE: Multidetector CT imaging of the chest was performed using the standard protocol during bolus administration of intravenous contrast. Multiplanar CT image reconstructions and MIPs were obtained to evaluate the vascular anatomy. CONTRAST:  75mL OMNIPAQUE IOHEXOL 350 MG/ML SOLN COMPARISON:  Chest x-ray 01/26/2021 FINDINGS: Cardiovascular: Satisfactory opacification of the pulmonary arteries to the segmental level. Saddle embolus straddling the right and left main pulmonary arteries. Extension of thrombus into right upper proximal segmental vessels, right middle lobe segmental and subsegmental vessels, right inter lobar artery and right lower lobe segmental and subsegmental pulmonary vessels. On the left, thrombus extends into left upper and lower lobe segmental and subsegmental vessels. Thrombus within descending left pulmonary artery. Positive for right heart strain with RV LV ratio of 1.2. Normal cardiac size. No pericardial effusion. Nonaneurysmal aorta. Mediastinum/Nodes: No enlarged mediastinal, hilar, or axillary lymph nodes. Thyroid gland, trachea, and esophagus demonstrate no significant findings. Lungs/Pleura: Lungs are clear. No pleural effusion or pneumothorax. Upper Abdomen: No acute abnormality. Musculoskeletal: No chest wall abnormality. No acute or significant osseous findings. Review of the MIP images confirms the above findings. IMPRESSION: Extensive bilateral PE including large saddle embolus. Positive for acute PE with CT evidence of right heart strain (RV/LV Ratio = 1.2) consistent with at least submassive (intermediate risk) PE. The  presence of right heart strain has been associated with an increased risk of morbidity and mortality. Please refer to the "PE Focused" order set in EPIC. Critical Value/emergent results were called by telephone at the time of interpretation on 01/26/2021 at 9:39 pm to provider [facility] MUTHERS[facility]UGH , who verbally acknowledged these results. Electronically Signed   By: Jasmine PangKim  Fujinaga M.D.   On: 01/26/2021 21:39   IR US Guide Vasc Access Right  Result Date: 01/27/2021 INDICATION: 45 year old female with intermediate risk pulmonary embolism and cor pulmonale on echo EXAM: ULTRASOUND-GUIDED ACCESS RIGHT COMMON FEMORAL VEIN ULTRASOUND-GUIDED ACCESS RIGHT COMMON FEMORAL VEIN PLACEMENT OF BILATERAL PULMONARY ARTERY THROMBOLYSIS CATHETERS INITIATION OF THROMBOLYSIS COMPARISON:  CT same day MEDICATIONS: None. ANESTHESIA/SEDATION: Versed 1.0 mg IV; Fentanyl 50 mcg IV Moderate Sedation Time:  25 minutes The patient was continuously monitored during the procedure by the interventional radiology nurse under my direct supervision. FLUOROSCOPY TIME:  Fluoroscopy Time: 5 minutes 0 seconds COMPLICATIONS: None TECHNIQUE: Informed consent was obtained from the patient and the patient's family following explanation of the procedure, risks, benefits and alternatives. Specific risks include bleeding, infection, contrast reaction, kidney injury, venous injury, life-threatening hemorrhage including brain hemorrhage, gastrointestinal hemorrhage, epistaxis, need for further surgery, need for further procedure, cardiopulmonary collapse, death. The patient understands, agrees and consents for the procedure. All questions were addressed. Patient is position supine position on the fluoroscopy table. Maximal barrier sterile technique utilized including caps, mask, sterile gowns, sterile gloves, large sterile drape,  hand hygiene, and betadine prep. 1% lidocaine used for local anesthesia. Moderate sedation was provided. Ultrasound survey of the  right inguinal region was performed with images stored and sent to PACs. A micropuncture needle was used access the right common femoral vein under ultrasound. With venous blood flow returned, microwire was placed and the needle was removed. A second micropuncture needle was then used to access the right common femoral vein, adjacent to the initial puncture. With venous blood flow return, microwire was placed and the needle was removed. The micro puncture sheath was then placed on the wire, with the wire and inner dilator removed. 035 wire was then passed through the sheath into the IVC. A 7 French sheath was placed over the wire. A second micro puncture sheath was then placed on the second wire, with the wire and inner dilator removed. 035 wire was then passed through the sheath into the IVC. Seven [place] standard vascular sheath was placed over the wire. Combination of a angled pigtail catheter and a Bentson wire was then used to navigate to the right heart. Angled catheter used to select the main pulmonary artery. Wire was removed and angiogram was performed. Stiff Glidewire was then advanced through the pigtail catheter, reducing the curve and flipping into the right-sided pulmonary artery. The wire was straightened into the downgoing branches of the right pulmonary artery. Pigtail catheter was then removed and a 10 cm infusion length UniFuse catheter was placed. Small contrast infusion confirmed position. Catheter was flushed. The alternative sheath was then used for selection of the contralateral pulmonary artery. Pigtail catheter was advanced into the right heart and used to select the main pulmonary artery. Again the exchange stiff Glidewire was used to reduce the curved and removed the pigtail catheter. A burn shaped 100 cm diagnostic catheter was then used to select the left-sided pulmonary arteries. Once the catheter was positioned into the lower lobar branches, contrast confirmed location. A 10 cm  infusion length UniFuse catheter was placed. Wire was removed, small amount of contrast confirmed position and catheter was flushed. The obturator wires were then placed through both the left and right catheters. Sheaths were secured in position.  Final image was stored. The patient tolerated the procedure well and remained hemodynamically stable throughout. No complications were encountered and no significant blood loss was encountered. FINDINGS: Ultrasound survey demonstrates patent right common femoral vein. Final image demonstrates right-sided catheter directed into lower lobar branches and left sided catheter directed into lower lobar branches. The 7 French (red) sheath to the patient's left transmits the right-sided catheter. The 7 French sheath towards the patient's right transmits the left-sided pulmonary artery catheter. IMPRESSION: Status post placement of left and right pulmonary artery infusion catheters for initiation of catheter directed thrombolysis. Signed, Yvone Neu. Loreta Ave, DO [facility] Radiology Electronically Signed   By: Gilmer Mor D.O.   On: 01/27/2021 17:43   IR US Guide Vasc Access Right  Result Date: 01/27/2021 INDICATION: 45 year old female with intermediate risk pulmonary embolism and cor pulmonale on echo EXAM: ULTRASOUND-GUIDED ACCESS RIGHT COMMON FEMORAL VEIN ULTRASOUND-GUIDED ACCESS RIGHT COMMON FEMORAL VEIN PLACEMENT OF BILATERAL PULMONARY ARTERY THROMBOLYSIS CATHETERS INITIATION OF THROMBOLYSIS COMPARISON:  CT same day MEDICATIONS: None. ANESTHESIA/SEDATION: Versed 1.0 mg IV; Fentanyl 50 mcg IV Moderate Sedation Time:  25 minutes The patient was continuously monitored during the procedure by the interventional radiology nurse under my direct supervision. FLUOROSCOPY TIME:  Fluoroscopy Time: 5 minutes 0 seconds COMPLICATIONS: None TECHNIQUE: Informed consent was  obtained from the patient and the patient's family following  explanation of the procedure, risks, benefits and alternatives. Specific risks include bleeding, infection, contrast reaction, kidney injury, venous injury, life-threatening hemorrhage including brain hemorrhage, gastrointestinal hemorrhage, epistaxis, need for further surgery, need for further procedure, cardiopulmonary collapse, death. The patient understands, agrees and consents for the procedure. All questions were addressed. Patient is position supine position on the fluoroscopy table. Maximal barrier sterile technique utilized including caps, mask, sterile gowns, sterile gloves, large sterile drape, hand hygiene, and betadine prep. 1% lidocaine used for local anesthesia. Moderate sedation was provided. Ultrasound survey of the right inguinal region was performed with images stored and sent to PACs. A micropuncture needle was used access the right common femoral vein under ultrasound. With venous blood flow returned, microwire was placed and the needle was removed. A second micropuncture needle was then used to access the right common femoral vein, adjacent to the initial puncture. With venous blood flow return, microwire was placed and the needle was removed. The micro puncture sheath was then placed on the wire, with the wire and inner dilator removed. 035 wire was then passed through the sheath into the IVC. A 7 French sheath was placed over the wire. A second micro puncture sheath was then placed on the second wire, with the wire and inner dilator removed. 035 wire was then passed through the sheath into the IVC. Seven [place] standard vascular sheath was placed over the wire. Combination of a angled pigtail catheter and a Bentson wire was then used to navigate to the right heart. Angled catheter used to select the main pulmonary artery. Wire was removed and angiogram was performed. Stiff Glidewire was then advanced through the pigtail catheter, reducing the curve and flipping into the right-sided pulmonary  artery. The wire was straightened into the downgoing branches of the right pulmonary artery. Pigtail catheter was then removed and a 10 cm infusion length UniFuse catheter was placed. Small contrast infusion confirmed position. Catheter was flushed. The alternative sheath was then used for selection of the contralateral pulmonary artery. Pigtail catheter was advanced into the right heart and used to select the main pulmonary artery. Again the exchange stiff Glidewire was used to reduce the curved and removed the pigtail catheter. A burn shaped 100 cm diagnostic catheter was then used to select the left-sided pulmonary arteries. Once the catheter was positioned into the lower lobar branches, contrast confirmed location. A 10 cm infusion length UniFuse catheter was placed. Wire was removed, small amount of contrast confirmed position and catheter was flushed. The obturator wires were then placed through both the left and right catheters. Sheaths were secured in position.  Final image was stored. The patient tolerated the procedure well and remained hemodynamically stable throughout. No complications were encountered and no significant blood loss was encountered. FINDINGS: Ultrasound survey demonstrates patent right common femoral vein. Final image demonstrates right-sided catheter directed into lower lobar branches and left sided catheter directed into lower lobar branches. The 7 French (red) sheath to the patient's left transmits the right-sided catheter. The 7 French sheath towards the patient's right transmits the left-sided pulmonary artery catheter. IMPRESSION: Status post placement of left and right pulmonary artery infusion catheters for initiation of catheter directed thrombolysis. Signed, Yvone Neu. Loreta Ave, DO [facility] Radiology Electronically Signed   By: Gilmer Mor D.O.   On: 01/27/2021 17:43   DG Chest Port 1 View  Result Date: 01/28/2021 CLINICAL  DATA:  Catheter placement EXAM:  PORTABLE CHEST 1 VIEW COMPARISON:  CTA chest dated 01/26/2021 FINDINGS: Pulmonary artery infusion catheters in the right and left main pulmonary arteries, satisfactory location without complication. Lungs are clear.  No pleural effusion or pneumothorax. The heart is normal in size. Enlargement of the main pulmonary artery, suggesting pulmonary arterial hypertension. IMPRESSION: Pulmonary artery infusion catheters in the right and left main pulmonary arteries. No evidence of acute cardiopulmonary disease. Electronically Signed   By: Charline Bills M.D.   On: 01/28/2021 09:32   ECHOCARDIOGRAM COMPLETE  Result Date: 01/27/2021    ECHOCARDIOGRAM REPORT   Patient Name:   [facility] Date of Exam: 01/27/2021 Medical Rec #:  [id number]     Height:       63.0 in Accession #:    [id number]    Weight:       220.2 lb Date of Birth:  [birth year]     BSA:          2.015 m Patient Age:    44 years      BP:           105/67 mmHg Patient Gender: F             HR:           92 bpm. Exam Location:  Inpatient Procedure: 2D Echo, Cardiac Doppler and Color Doppler Indications:     Pulmonary embolus  History:         Patient has no prior history of Echocardiogram examinations.  Sonographer:     Shirlean Kelly Referring Phys:  [id number] Elenore Paddy REESE Diagnosing Phys: Lennie Odor MD IMPRESSIONS  1. Severe RV dilation and moderately reduced function consistent with cor pulmonale in setting of acute PE.  2. Left ventricular ejection fraction, by estimation, is 60 to 65%. The left ventricle has normal function. The left ventricle has no regional wall motion abnormalities. Left ventricular diastolic parameters were normal. There is the interventricular septum is flattened in systole and diastole, consistent with right ventricular pressure and volume overload.  3. Right ventricular systolic function is moderately reduced. The right ventricular size is severely enlarged. There is normal pulmonary  artery systolic pressure. The estimated right ventricular systolic pressure is 29.7 mmHg.  4. Right atrial size was mildly dilated.  5. The mitral valve is grossly normal. No evidence of mitral valve regurgitation. No evidence of mitral stenosis.  6. The aortic valve is tricuspid. Aortic valve regurgitation is not visualized. No aortic stenosis is present.  7. The inferior vena cava is dilated in size with >50% respiratory variability, suggesting right atrial pressure of 8 mmHg. Conclusion(s)/Recommendation(s): Findings consistent with Cor Pulmonale. FINDINGS  Left Ventricle: Left ventricular ejection fraction, by estimation, is 60 to 65%. The left ventricle has normal function. The left ventricle has no regional wall motion abnormalities. The left ventricular internal cavity size was small. There is no left ventricular hypertrophy. The interventricular septum is flattened in systole and diastole, consistent with right ventricular pressure and volume overload. Left ventricular diastolic parameters were normal. Right Ventricle: The right ventricular size is severely enlarged. No increase in right ventricular wall thickness. Right ventricular systolic function is moderately reduced. There is normal pulmonary artery systolic pressure. The tricuspid regurgitant velocity is 2.33 m/s, and with an assumed right atrial pressure of 8 mmHg, the estimated right ventricular systolic pressure is 29.7 mmHg. Left Atrium: Left atrial size was normal in size. Right Atrium: Right atrial size was mildly dilated. Pericardium: Trivial pericardial effusion is present. Mitral  Valve: The mitral valve is grossly normal. No evidence of mitral valve regurgitation. No evidence of mitral valve stenosis. Tricuspid Valve: The tricuspid valve is grossly normal. Tricuspid valve regurgitation is trivial. No evidence of tricuspid stenosis. Aortic Valve: The aortic valve is tricuspid. Aortic valve regurgitation is not visualized. No aortic stenosis is  present. Pulmonic Valve: The pulmonic valve was grossly normal. Pulmonic valve regurgitation is trivial. No evidence of pulmonic stenosis. Aorta: The aortic root and ascending aorta are structurally normal, with no evidence of dilitation. Venous: The inferior vena cava is dilated in size with greater than 50% respiratory variability, suggesting right atrial pressure of 8 mmHg. IAS/Shunts: The atrial septum is grossly normal.  LEFT VENTRICLE PLAX 2D LVIDd:         3.60 cm  Diastology LVIDs:         2.60 cm  LV e' medial:    9.79 cm/s LV PW:         0.90 cm  LV E/e' medial:  6.4 LV IVS:        0.90 cm  LV e' lateral:   9.03 cm/s LVOT diam:     1.80 cm  LV E/e' lateral: 7.0 LV SV:         33 LV SV Index:   16 LVOT Area:     2.54 cm  RIGHT VENTRICLE            IVC RV S prime:     9.90 cm/s  IVC diam: 2.30 cm TAPSE (M-mode): 2.2 cm LEFT ATRIUM             Index       RIGHT ATRIUM           Index LA diam:        2.60 cm 1.29 cm/m  RA Area:     13.50 cm LA Vol (A2C):   20.8 ml 10.33 ml/m RA Volume:   36.60 ml  18.17 ml/m LA Vol (A4C):   21.0 ml 10.42 ml/m LA Biplane Vol: 22.4 ml 11.12 ml/m  AORTIC VALVE LVOT Vmax:   100.65 cm/s LVOT Vmean:  62.500 cm/s LVOT VTI:    0.128 m  AORTA Ao Root diam: 2.50 cm Ao Asc diam:  3.00 cm MITRAL VALVE               TRICUSPID VALVE MV Area (PHT): 4.21 cm    TR Peak grad:   21.7 mmHg MV Decel Time: 180 msec    TR Vmax:        233.00 cm/s MV E velocity: 63.10 cm/s MV A velocity: 56.30 cm/s  SHUNTS MV E/A ratio:  1.12        Systemic VTI:  0.13 m                            Systemic Diam: 1.80 cm Lennie Odor MD Electronically signed by Lennie Odor MD Signature Date/Time: 01/27/2021/2:16:30 PM    Final (Updated)    VAS [place] LOWER EXTREMITY VENOUS (DVT)  Result Date: 01/27/2021  Lower Venous DVT Study Patient Name:  [facility]  Date of Exam:   01/27/2021 Medical Rec #: [id number]      Accession #:    [id number] Date of Birth: 01-15-1976      Patient Gender: F Patient Age:   28Y  Exam Location:  [facility] Procedure:      VAS [place] LOWER EXTREMITY VENOUS (DVT) Referring  Phys: [id number] STEPHANIE M REESE --------------------------------------------------------------------------------  Indications: Pulmonary embolism.  Risk Factors: Confirmed PE. Anticoagulation: Heparin. Limitations: Poor ultrasound/tissue interface. Comparison Study: No prior studies. Performing Technologist: Chanda Busing RVT  Examination Guidelines: A complete evaluation includes B-mode imaging, spectral Doppler, color Doppler, and power Doppler as needed of all accessible portions of each vessel. Bilateral testing is considered an integral part of a complete examination. Limited examinations for reoccurring indications may be performed as noted. The reflux portion of the exam is performed with the patient in reverse Trendelenburg.  +---------+---------------+---------+-----------+----------+--------------+ RIGHT    CompressibilityPhasicitySpontaneityPropertiesThrombus Aging +---------+---------------+---------+-----------+----------+--------------+ CFV      Full           Yes      Yes                                 +---------+---------------+---------+-----------+----------+--------------+ SFJ      Full                                                        +---------+---------------+---------+-----------+----------+--------------+ FV Prox  Full                                                        +---------+---------------+---------+-----------+----------+--------------+ FV Mid   Full                                                        +---------+---------------+---------+-----------+----------+--------------+ FV DistalFull                                                        +---------+---------------+---------+-----------+----------+--------------+ PFV      Full                                                         +---------+---------------+---------+-----------+----------+--------------+ POP      Full           Yes      Yes                                 +---------+---------------+---------+-----------+----------+--------------+ PTV      Full                                                        +---------+---------------+---------+-----------+----------+--------------+ PERO     Full                                                        +---------+---------------+---------+-----------+----------+--------------+   +---------+---------------+---------+-----------+----------+--------------+  LEFT     CompressibilityPhasicitySpontaneityPropertiesThrombus Aging +---------+---------------+---------+-----------+----------+--------------+ CFV      Full           Yes      Yes                                 +---------+---------------+---------+-----------+----------+--------------+ SFJ      Full                                                        +---------+---------------+---------+-----------+----------+--------------+ FV Prox  Full                                                        +---------+---------------+---------+-----------+----------+--------------+ FV Mid   Full                                                        +---------+---------------+---------+-----------+----------+--------------+ FV DistalFull                                                        +---------+---------------+---------+-----------+----------+--------------+ PFV      Full                                                        +---------+---------------+---------+-----------+----------+--------------+ POP      Full           Yes      Yes                                 +---------+---------------+---------+-----------+----------+--------------+ PTV      Full                                                         +---------+---------------+---------+-----------+----------+--------------+ PERO     Full                                                        +---------+---------------+---------+-----------+----------+--------------+     Summary: RIGHT: - There is no evidence of deep vein thrombosis in the lower extremity.  - No cystic structure found in the popliteal fossa.  LEFT: - There is no evidence of deep vein thrombosis in the lower extremity.  - No  cystic structure found in the popliteal fossa.  *See table(s) above for measurements and observations. Electronically signed by Sherald Hess MD on 01/27/2021 at 12:31:54 PM.    Final    IR INFUSION THROMBOL ARTERIAL INITIAL (MS)  Result Date: 01/27/2021 INDICATION: 45 year old female with intermediate risk pulmonary embolism and cor pulmonale on echo EXAM: ULTRASOUND-GUIDED ACCESS RIGHT COMMON FEMORAL VEIN ULTRASOUND-GUIDED ACCESS RIGHT COMMON FEMORAL VEIN PLACEMENT OF BILATERAL PULMONARY ARTERY THROMBOLYSIS CATHETERS INITIATION OF THROMBOLYSIS COMPARISON:  CT same day MEDICATIONS: None. ANESTHESIA/SEDATION: Versed 1.0 mg IV; Fentanyl 50 mcg IV Moderate Sedation Time:  25 minutes The patient was continuously monitored during the procedure by the interventional radiology nurse under my direct supervision. FLUOROSCOPY TIME:  Fluoroscopy Time: 5 minutes 0 seconds COMPLICATIONS: None TECHNIQUE: Informed consent was obtained from the patient and the patient's family following explanation of the procedure, risks, benefits and alternatives. Specific risks include bleeding, infection, contrast reaction, kidney injury, venous injury, life-threatening hemorrhage including brain hemorrhage, gastrointestinal hemorrhage, epistaxis, need for further surgery, need for further procedure, cardiopulmonary collapse, death. The patient understands, agrees and consents for the procedure. All questions were addressed. Patient is position supine position on the fluoroscopy table.  Maximal barrier sterile technique utilized including caps, mask, sterile gowns, sterile gloves, large sterile drape, hand hygiene, and betadine prep. 1% lidocaine used for local anesthesia. Moderate sedation was provided. Ultrasound survey of the right inguinal region was performed with images stored and sent to PACs. A micropuncture needle was used access the right common femoral vein under ultrasound. With venous blood flow returned, microwire was placed and the needle was removed. A second micropuncture needle was then used to access the right common femoral vein, adjacent to the initial puncture. With venous blood flow return, microwire was placed and the needle was removed. The micro puncture sheath was then placed on the wire, with the wire and inner dilator removed. 035 wire was then passed through the sheath into the IVC. A 7 French sheath was placed over the wire. A second micro puncture sheath was then placed on the second wire, with the wire and inner dilator removed. 035 wire was then passed through the sheath into the IVC. Seven [place] standard vascular sheath was placed over the wire. Combination of a angled pigtail catheter and a Bentson wire was then used to navigate to the right heart. Angled catheter used to select the main pulmonary artery. Wire was removed and angiogram was performed. Stiff Glidewire was then advanced through the pigtail catheter, reducing the curve and flipping into the right-sided pulmonary artery. The wire was straightened into the downgoing branches of the right pulmonary artery. Pigtail catheter was then removed and a 10 cm infusion length UniFuse catheter was placed. Small contrast infusion confirmed position. Catheter was flushed. The alternative sheath was then used for selection of the contralateral pulmonary artery. Pigtail catheter was advanced into the right heart and used to select the main pulmonary artery. Again the exchange stiff Glidewire was used to reduce the  curved and removed the pigtail catheter. A burn shaped 100 cm diagnostic catheter was then used to select the left-sided pulmonary arteries. Once the catheter was positioned into the lower lobar branches, contrast confirmed location. A 10 cm infusion length UniFuse catheter was placed. Wire was removed, small amount of contrast confirmed position and catheter was flushed. The obturator wires were then placed through both the left and right catheters. Sheaths were secured in position.  Final image was stored. The patient tolerated the procedure well  and remained hemodynamically stable throughout. No complications were encountered and no significant blood loss was encountered. FINDINGS: Ultrasound survey demonstrates patent right common femoral vein. Final image demonstrates right-sided catheter directed into lower lobar branches and left sided catheter directed into lower lobar branches. The 7 French (red) sheath to the patient's left transmits the right-sided catheter. The 7 French sheath towards the patient's right transmits the left-sided pulmonary artery catheter. IMPRESSION: Status post placement of left and right pulmonary artery infusion catheters for initiation of catheter directed thrombolysis. Signed, Yvone Neu. Loreta Ave, DO [facility] Electronically Signed   By: Gilmer Mor D.O.   On: 01/27/2021 17:43       Subjective:  Patient seen and examined the bedside this morning.  Hemodynamically stable for discharge today.   Discharge Exam: Vitals:   01/30/21 0700 01/30/21 0804  BP: 125/89   Pulse: 84   Resp: 20   Temp:  98.3 F (36.8 C)  SpO2: 98%    Vitals:   01/30/21 0400 01/30/21 0500 01/30/21 0700 01/30/21 0804  BP: 122/86 98/67 125/89   Pulse: 80 86 84   Resp: 18 19 20    Temp: 98.1 F (36.7 C)   98.3 F (36.8 C)  TempSrc: Oral   Oral  SpO2: 99% 95% 98%   Weight:      Height:        General: Pt is alert, awake, not in  acute distress Cardiovascular: RRR, S1/S2 +, no rubs, no gallops Respiratory: CTA bilaterally, no wheezing, no rhonchi Abdominal: Soft, NT, ND, bowel sounds + Extremities: no edema, no cyanosis    The results of significant diagnostics from this hospitalization (including imaging, microbiology, ancillary and laboratory) are listed below for reference.     Microbiology: Recent Results (from the past 240 hour(s))  Resp Panel by RT-PCR (Flu A&B, Covid) Nasopharyngeal Swab     Status: None   Collection Time: 01/26/21  9:50 PM   Specimen: Nasopharyngeal Swab; Nasopharyngeal(NP) swabs in vial transport medium  Result Value Ref Range Status   SARS Coronavirus 2 by RT PCR NEGATIVE NEGATIVE Final    Comment: (NOTE) SARS-CoV-2 target nucleic acids are NOT DETECTED.  The SARS-CoV-2 RNA is generally detectable in upper respiratory specimens during the acute phase of infection. The lowest concentration of SARS-CoV-2 viral copies this assay can detect is 138 copies/mL. A negative result does not preclude SARS-Cov-2 infection and should not be used as the sole basis for treatment or other patient management decisions. A negative result may occur with  improper specimen collection/handling, submission of specimen other than nasopharyngeal swab, presence of viral mutation(s) within the areas targeted by this assay, and inadequate number of viral copies(<138 copies/mL). A negative result must be combined with clinical observations, patient history, and epidemiological information. The expected result is Negative.  Fact Sheet for Patients:  01/28/21  Fact Sheet for Healthcare Providers:  BloggerCourse.com  This test is no t yet approved or cleared by the [place] FDA and  has been authorized for detection and/or diagnosis of SARS-CoV-2 by FDA under an Emergency Use Authorization (EUA). This EUA will remain  in effect (meaning this  test can be used) for the duration of the COVID-19 declaration under Section 564(b)(1) of the Act, 21 U.S.C.section 360bbb-3(b)(1), unless the authorization is terminated  or revoked sooner.       Influenza A by PCR NEGATIVE NEGATIVE Final   Influenza B by PCR NEGATIVE NEGATIVE Final    Comment: (NOTE)  The Xpert Xpress SARS-CoV-2/FLU/RSV plus assay is intended as an aid in the diagnosis of influenza from Nasopharyngeal swab specimens and should not be used as a sole basis for treatment. Nasal washings and aspirates are unacceptable for Xpert Xpress SARS-CoV-2/FLU/RSV testing.  Fact Sheet for Patients: BloggerCourse.com  Fact Sheet for Healthcare Providers: SeriousBroker.it  This test is not yet approved or cleared by the [place] FDA and has been authorized for detection and/or diagnosis of SARS-CoV-2 by FDA under an Emergency Use Authorization (EUA). This EUA will remain in effect (meaning this test can be used) for the duration of the COVID-19 declaration under Section 564(b)(1) of the Act, 21 U.S.C. section 360bbb-3(b)(1), unless the authorization is terminated or revoked.  Performed at [facility], [address] [place], [place] [address]   MRSA PCR Screening     Status: None   Collection Time: 01/27/21  8:38 AM  Result Value Ref Range Status   MRSA by PCR NEGATIVE NEGATIVE Final    Comment:        The GeneXpert MRSA Assay (FDA approved for NASAL specimens only), is one component of a comprehensive MRSA colonization surveillance program. It is not intended to diagnose MRSA infection nor to guide or monitor treatment for MRSA infections. Performed at [facility], [address] [place], [place] [address]      Labs: BNP (last 3 results) Recent Labs    01/26/21 2016  BNP 205.1*   Basic Metabolic Panel: Recent Labs  Lab 01/26/21 2016 01/27/21 0302 01/28/21 0556  01/29/21 0233 01/30/21 0244  NA 138 138 139 139 141  K 3.4* 3.3* 4.3 3.6 3.9  CL 106 108 112* 110 110  CO2 21* 17* 18* 22 24  GLUCOSE 102* 94 75 88 93  BUN CREATININE 0.72 0.62 0.81 0.63 0.51  CALCIUM 8.9 8.7* 8.7* 8.5* 8.9  MG 1.9 1.7 1.9  --  1.7  PHOS  --  3.5  --   --   --    Liver Function Tests: Recent Labs  Lab 01/26/21 2016  AST 23  ALT 17  ALKPHOS 53  BILITOT 0.3  PROT 7.0  ALBUMIN 3.7   Recent Labs  Lab 01/26/21 2016  LIPASE 31   No results for input(s): AMMONIA in the last 168 hours. CBC: Recent Labs  Lab 01/26/21 2016 01/27/21 0302 01/27/21 2342 01/28/21 0556 01/28/21 1140 01/29/21 0233 01/30/21 0244  WBC 6.9   < > 6.1 6.8 6.7 5.7 5.0  NEUTROABS 4.5  --   --   --   --   --   --   HGB 12.8   < > 12.5 12.0 11.5* 10.5* 10.8*  HCT 36.8   < > 38.8 36.3 35.1* 32.4* 33.6*  MCV 81.2   < > 85.5 83.8 85.6 86.4 87.0  PLT 207   < > 158 128* 134* 129* 155   < > = values in this interval not displayed.   Cardiac Enzymes: No results for input(s): CKTOTAL, CKMB, CKMBINDEX, TROPONINI in the last 168 hours. BNP: Invalid input(s): POCBNP CBG: Recent Labs  Lab 01/28/21 0846  GLUCAP 76   D-Dimer No results for input(s): DDIMER in the last 72 hours. Hgb A1c No results for input(s): HGBA1C in the last 72 hours. Lipid Profile No results for input(s): CHOL, HDL, LDLCALC, TRIG, CHOLHDL, LDLDIRECT in the last 72 hours. Thyroid function studies No results for input(s): TSH, T4TOTAL, T3FREE, THYROIDAB in  the last 72 hours.  Invalid input(s): FREET3 Anemia work up No results for input(s): VITAMINB12, FOLATE, FERRITIN, TIBC, IRON, RETICCTPCT in the last 72 hours. Urinalysis    Component Value Date/Time   COLORURINE RED (A) 01/27/2021 0143   APPEARANCEUR HAZY (A) 01/27/2021 0143   LABSPEC >1.046 (H) 01/27/2021 0143   PHURINE 6.0 01/27/2021 0143   GLUCOSEU 50 (A) 01/27/2021 0143   HGBUR LARGE (A) 01/27/2021 0143   BILIRUBINUR NEGATIVE  01/27/2021 0143   KETONESUR 20 (A) 01/27/2021 0143   PROTEINUR 100 (A) 01/27/2021 0143   NITRITE NEGATIVE 01/27/2021 0143   LEUKOCYTESUR NEGATIVE 01/27/2021 0143   Sepsis Labs Invalid input(s): PROCALCITONIN,  WBC,  LACTICIDVEN Microbiology Recent Results (from the past 240 hour(s))  Resp Panel by RT-PCR (Flu A&B, Covid) Nasopharyngeal Swab     Status: None   Collection Time: 01/26/21  9:50 PM   Specimen: Nasopharyngeal Swab; Nasopharyngeal(NP) swabs in vial transport medium  Result Value Ref Range Status   SARS Coronavirus 2 by RT PCR NEGATIVE NEGATIVE Final    Comment: (NOTE) SARS-CoV-2 target nucleic acids are NOT DETECTED.  The SARS-CoV-2 RNA is generally detectable in upper respiratory specimens during the acute phase of infection. The lowest concentration of SARS-CoV-2 viral copies this assay can detect is 138 copies/mL. A negative result does not preclude SARS-Cov-2 infection and should not be used as the sole basis for treatment or other patient management decisions. A negative result may occur with  improper specimen collection/handling, submission of specimen other than nasopharyngeal swab, presence of viral mutation(s) within the areas targeted by this assay, and inadequate number of viral copies(<138 copies/mL). A negative result must be combined with clinical observations, patient history, and epidemiological information. The expected result is Negative.  Fact Sheet for Patients:  BloggerCourse.com  Fact Sheet for Healthcare Providers:  SeriousBroker.it  This test is no t yet approved or cleared by the [place] FDA and  has been authorized for detection and/or diagnosis of SARS-CoV-2 by FDA under an Emergency Use Authorization (EUA). This EUA will remain  in effect (meaning this test can be used) for the duration of the COVID-19 declaration under Section 564(b)(1) of the Act, 21 U.S.C.section 360bbb-3(b)(1),  unless the authorization is terminated  or revoked sooner.       Influenza A by PCR NEGATIVE NEGATIVE Final   Influenza B by PCR NEGATIVE NEGATIVE Final    Comment: (NOTE) The Xpert Xpress SARS-CoV-2/FLU/RSV plus assay is intended as an aid in the diagnosis of influenza from Nasopharyngeal swab specimens and should not be used as a sole basis for treatment. Nasal washings and aspirates are unacceptable for Xpert Xpress SARS-CoV-2/FLU/RSV testing.  Fact Sheet for Patients: BloggerCourse.com  Fact Sheet for Healthcare Providers: SeriousBroker.it  This test is not yet approved or cleared by the [place] FDA and has been authorized for detection and/or diagnosis of SARS-CoV-2 by FDA under an Emergency Use Authorization (EUA). This EUA will remain in effect (meaning this test can be used) for the duration of the COVID-19 declaration under Section 564(b)(1) of the Act, 21 U.S.C. section 360bbb-3(b)(1), unless the authorization is terminated or revoked.  Performed at [facility] [address] [place], [place] [address]   MRSA PCR Screening     Status: None   Collection Time: 01/27/21  8:38 AM  Result Value Ref Range Status   MRSA by PCR NEGATIVE NEGATIVE Final    Comment:        The GeneXpert MRSA Assay (  FDA approved for NASAL specimens only), is one component of a comprehensive MRSA colonization surveillance program. It is not intended to diagnose MRSA infection nor to guide or monitor treatment for MRSA infections. Performed at [facility], [address], [place], [place] [address]     Please note: You were cared for by a hospitalist during your hospital stay. Once you are discharged, your primary care physician will handle any further medical issues. Please note that NO REFILLS for any discharge medications will be authorized once you are discharged, as it is imperative that you  return to your primary care physician (or establish a relationship with a primary care physician if you do not have one) for your post hospital discharge needs so that they can reassess your need for medications and monitor your lab values.    Time coordinating discharge: 40 minutes  SIGNED:   Burnadette Pop, MD  [facility] 01/30/2021, 8:36 AM Pager [phone]  If 7PM-7AM, please contact night-coverage [email or web address] Password [email or web address]

## 2021-01-30 NOTE — Progress Notes (Signed)
Patient discharged, all belongings sent home with patient. IV removed and patient escorted safely out of the hospital

## 2021-01-31 NOTE — Telephone Encounter (Signed)
I have called and spoke with pt and she is aware of appt with JD on 7/8 at 945

## 2021-02-08 ENCOUNTER — Telehealth: Payer: Self-pay | Admitting: *Deleted

## 2021-02-08 NOTE — Telephone Encounter (Signed)
Per referral called and lvm of upcoming appointments - mailed calendar and welcome packet

## 2021-03-10 ENCOUNTER — Other Ambulatory Visit: Payer: Self-pay | Admitting: Family

## 2021-03-10 ENCOUNTER — Encounter: Payer: Self-pay | Admitting: Pulmonary Disease

## 2021-03-10 ENCOUNTER — Ambulatory Visit (INDEPENDENT_AMBULATORY_CARE_PROVIDER_SITE_OTHER): Payer: 59 | Admitting: Pulmonary Disease

## 2021-03-10 ENCOUNTER — Other Ambulatory Visit: Payer: Self-pay

## 2021-03-10 VITALS — BP 110/78 | HR 95 | Ht 63.0 in | Wt 224.0 lb

## 2021-03-10 DIAGNOSIS — I2602 Saddle embolus of pulmonary artery with acute cor pulmonale: Secondary | ICD-10-CM | POA: Diagnosis not present

## 2021-03-10 DIAGNOSIS — D649 Anemia, unspecified: Secondary | ICD-10-CM

## 2021-03-10 DIAGNOSIS — D6859 Other primary thrombophilia: Secondary | ICD-10-CM

## 2021-03-10 DIAGNOSIS — I2601 Septic pulmonary embolism with acute cor pulmonale: Secondary | ICD-10-CM

## 2021-03-10 NOTE — Patient Instructions (Signed)
Continue eliquis 5 mg daily. My recommendation is to continue the anticoagulation indefinitely unless you develop a significant risk factor to have the treatment stopped.   We will follow up hematology recommendations as well.   We will schedule you for an echocardiogram in late November and a follow up visit with [place] in early December.   Please call if any symptoms return.

## 2021-03-10 NOTE — Progress Notes (Signed)
Synopsis: Referred in July 2022 for Hospital follow up for PE  Subjective:   PATIENT ID: Annette Lewis GENDER: female DOB: 04-15-1976, MRN: [id number]   HPI  Chief Complaint  Patient presents with   Hospitalization Follow-up    States she has been doing well since being home. Denies any bruising, nosebleeds or SOB. Has been tolerating the Eliquis well.    Annette Lewis is a 45 year old woman, never smoker with history of DVT and recent admission for saddle pulmonary embolus who comes to pulmonary clinic for hospital follow up.   She was admitted 5/27-5/30 where she presented with shortness of breath and was found to have acute pulmonary embolus with right heart strain noted on echocardiogram.  She had catheter directed thrombolysis performed by interventional radiology.  She denies any issues at the femoral insertion site at this time.  She reports she is doing well on her Eliquis therapy and denies any bleeding issues at this time.  She denies any shortness of breath, lightheadedness, dizziness or syncopal/presyncopal episodes since admission.  She denies any chest pain or hemoptysis.  She denies any lower extremity edema.  She denies any nighttime awakenings with shortness of breath.  She denies any cough or wheezing.  Past Medical History:  Diagnosis Date   DVT (deep venous thrombosis) (HCC)    Pulmonary embolism (HCC)      History reviewed. No pertinent family history.   Social History   Socioeconomic History   Marital status: Married    Spouse name: Not on file   Number of children: Not on file   Years of education: Not on file   Highest education level: Not on file  Occupational History   Not on file  Tobacco Use   Smoking status: Never   Smokeless tobacco: Never  Substance and Sexual Activity   Alcohol use: Not Currently   Drug use: Never   Sexual activity: Not on file  Other Topics Concern   Not on file  Social History Narrative   Not on file   Social  Determinants of Health   Financial Resource Strain: Not on file  Food Insecurity: Not on file  Transportation Needs: Not on file  Physical Activity: Not on file  Stress: Not on file  Social Connections: Not on file  Intimate Partner Violence: Not on file     No Known Allergies   Outpatient Medications Prior to Visit  Medication Sig Dispense Refill   apixaban (ELIQUIS) 5 MG TABS tablet Take 1 tablet (5 mg total) by mouth 2 (two) times daily. 60 tablet 0   cholecalciferol (VITAMIN D3) 25 MCG (1000 UNIT) tablet Take 5,000 Units by mouth daily.     magnesium 30 MG tablet Take 30 mg by mouth 2 (two) times daily.     Multiple Vitamin (MULTIVITAMIN) tablet Take 1 tablet by mouth daily.     POTASSIUM CHLORIDE PO Take 15 mLs by mouth daily. Potassium salt.     terbinafine (LAMISIL) 250 MG tablet Take 250 mg by mouth daily.     apixaban (ELIQUIS) 5 MG TABS tablet Take 2 tablets (10 mg total) by mouth 2 (two) times daily for 6 days. 24 tablet 0   No facility-administered medications prior to visit.    Review of Systems  Constitutional:  Negative for chills, fever, malaise/fatigue and weight loss.  HENT:  Negative for congestion, sinus pain and sore throat.   Eyes: Negative.   Respiratory:  Negative for cough, hemoptysis, sputum production, shortness  of breath and wheezing.   Cardiovascular:  Negative for chest pain, palpitations, orthopnea, claudication and leg swelling.  Gastrointestinal:  Negative for abdominal pain, heartburn, nausea and vomiting.  Genitourinary: Negative.   Musculoskeletal:  Negative for joint pain and myalgias.  Skin:  Negative for rash.  Neurological:  Negative for weakness.  Endo/Heme/Allergies: Negative.   Psychiatric/Behavioral: Negative.       Objective:   Vitals:   03/10/21 0956  BP: 110/78  Pulse: 95  SpO2: 99%  Weight: 224 lb (101.6 kg)  Height: 5\' 3"  (1.6 m)     Physical Exam Constitutional:      General: She is not in acute distress.     Appearance: She is obese. She is not ill-appearing.  HENT:     Head: Normocephalic and atraumatic.  Eyes:     General: No scleral icterus.    Conjunctiva/sclera: Conjunctivae normal.     Pupils: Pupils are equal, round, and reactive to light.  Cardiovascular:     Rate and Rhythm: Normal rate and regular rhythm.     Pulses: Normal pulses.     Heart sounds: Normal heart sounds. No murmur heard. Pulmonary:     Effort: Pulmonary effort is normal.     Breath sounds: Normal breath sounds. No wheezing, rhonchi or rales.  Abdominal:     General: Bowel sounds are normal.     Palpations: Abdomen is soft.  Musculoskeletal:     Right lower leg: No edema.     Left lower leg: No edema.  Lymphadenopathy:     Cervical: No cervical adenopathy.  Skin:    General: Skin is warm and dry.  Neurological:     General: No focal deficit present.     Mental Status: She is alert.  Psychiatric:        Mood and Affect: Mood normal.        Behavior: Behavior normal.        Thought Content: Thought content normal.        Judgment: Judgment normal.   CBC    Component Value Date/Time   WBC 5.0 01/30/2021 0244   RBC 3.86 (L) 01/30/2021 0244   HGB 10.8 (L) 01/30/2021 0244   HCT 33.6 (L) 01/30/2021 0244   PLT 155 01/30/2021 0244   MCV 87.0 01/30/2021 0244   MCH 28.0 01/30/2021 0244   MCHC 32.1 01/30/2021 0244   RDW 13.6 01/30/2021 0244   LYMPHSABS 1.8 01/26/2021 2016   MONOABS 0.4 01/26/2021 2016   EOSABS 0.1 01/26/2021 2016   BASOSABS 0.0 01/26/2021 2016   BMP Latest Ref Rng & Units 01/30/2021 01/29/2021 01/28/2021  Glucose 70 - 99 mg/dL 93 88 75  BUN 6 - 20 mg/dL 12 11 8   Creatinine 0.44 - 1.00 mg/dL 01/30/2021 4.09  Sodium 135 - 145 mmol/L 141 139 139  Potassium 3.5 - 5.1 mmol/L 3.9 3.6 4.3  Chloride 98 - 111 mmol/L 110 110 112(H)  CO2 22 - 32 mmol/L 24 22 18(L)  Calcium 8.9 - 10.3 mg/dL 8.9 8.11) 9.14)   Chest imaging: CTA Chest 01/26/21 Saddle embolus straddling the right and left main  pulmonary arteries. Extension of thrombus into right upper proximal segmental vessels, right middle lobe segmental and subsegmental vessels, right inter lobar artery and right lower lobe segmental and subsegmental pulmonary vessels. On the left, thrombus extends into left upper and lower lobe segmental and subsegmental vessels. Thrombus within descending left pulmonary artery.  PFT: No flowsheet data found.  Echo 01/27/2021: 1.  Severe RV dilation and moderately reduced function consistent with cor  pulmonale in setting of acute PE.   2. Left ventricular ejection fraction, by estimation, is 60 to 65%. The  left ventricle has normal function. The left ventricle has no regional  wall motion abnormalities. Left ventricular diastolic parameters were  normal. There is the interventricular  septum is flattened in systole and diastole, consistent with right  ventricular pressure and volume overload.   3. Right ventricular systolic function is moderately reduced. The right  ventricular size is severely enlarged. There is normal pulmonary artery  systolic pressure. The estimated right ventricular systolic pressure is  29.7 mmHg.   4. Right atrial size was mildly dilated.   5. The mitral valve is grossly normal. No evidence of mitral valve  regurgitation. No evidence of mitral stenosis.   6. The aortic valve is tricuspid. Aortic valve regurgitation is not  visualized. No aortic stenosis is present.   7. The inferior vena cava is dilated in size with >50% respiratory  variability, suggesting right atrial pressure of 8 mmHg.    Assessment & Plan:   Acute saddle pulmonary embolism with acute cor pulmonale (HCC) - Plan: ECHOCARDIOGRAM COMPLETE  Discussion: Annette Lewis is a 45 year old woman, never smoker with history of DVT and recent admission for saddle pulmonary embolus who comes to pulmonary clinic for hospital follow up.   Given that she had a DVT in the past and most recently a  pulmonary embolus with right heart strain she is to remain on Eliquis therapy indefinitely unless she develops a severe bleeding risk.    We will follow-up an echocardiogram in late November or early December with a follow-up visit soon after.  Follow-up in 5 months.  Annette Comas, MD [facility] Office: [phone]    Current Outpatient Medications:    apixaban (ELIQUIS) 5 MG TABS tablet, Take 1 tablet (5 mg total) by mouth 2 (two) times daily., Disp: 60 tablet, Rfl: 0   cholecalciferol (VITAMIN D3) 25 MCG (1000 UNIT) tablet, Take 5,000 Units by mouth daily., Disp: , Rfl:    magnesium 30 MG tablet, Take 30 mg by mouth 2 (two) times daily., Disp: , Rfl:    Multiple Vitamin (MULTIVITAMIN) tablet, Take 1 tablet by mouth daily., Disp: , Rfl:    POTASSIUM CHLORIDE PO, Take 15 mLs by mouth daily. Potassium salt., Disp: , Rfl:    terbinafine (LAMISIL) 250 MG tablet, Take 250 mg by mouth daily., Disp: , Rfl:    apixaban (ELIQUIS) 5 MG TABS tablet, Take 2 tablets (10 mg total) by mouth 2 (two) times daily for 6 days., Disp: 24 tablet, Rfl: 0

## 2021-03-13 ENCOUNTER — Inpatient Hospital Stay: Payer: 59 | Attending: Family

## 2021-03-13 ENCOUNTER — Other Ambulatory Visit: Payer: Self-pay

## 2021-03-13 ENCOUNTER — Encounter: Payer: Self-pay | Admitting: Family

## 2021-03-13 ENCOUNTER — Inpatient Hospital Stay (HOSPITAL_BASED_OUTPATIENT_CLINIC_OR_DEPARTMENT_OTHER): Payer: 59 | Admitting: Family

## 2021-03-13 VITALS — BP 123/64 | HR 87 | Temp 98.4°F | Resp 18 | Ht 63.0 in | Wt 225.0 lb

## 2021-03-13 DIAGNOSIS — Z7901 Long term (current) use of anticoagulants: Secondary | ICD-10-CM | POA: Diagnosis not present

## 2021-03-13 DIAGNOSIS — D649 Anemia, unspecified: Secondary | ICD-10-CM | POA: Insufficient documentation

## 2021-03-13 DIAGNOSIS — Z86718 Personal history of other venous thrombosis and embolism: Secondary | ICD-10-CM | POA: Insufficient documentation

## 2021-03-13 DIAGNOSIS — I2601 Septic pulmonary embolism with acute cor pulmonale: Secondary | ICD-10-CM

## 2021-03-13 DIAGNOSIS — I2699 Other pulmonary embolism without acute cor pulmonale: Secondary | ICD-10-CM

## 2021-03-13 DIAGNOSIS — D6859 Other primary thrombophilia: Secondary | ICD-10-CM

## 2021-03-13 LAB — IRON AND TIBC
Iron: 52 ug/dL (ref 41–142)
Saturation Ratios: 14 % — ABNORMAL LOW (ref 21–57)
TIBC: 368 ug/dL (ref 236–444)
UIBC: 315 ug/dL (ref 120–384)

## 2021-03-13 LAB — CBC WITH DIFFERENTIAL (CANCER CENTER ONLY)
Abs Immature Granulocytes: 0.02 10*3/uL (ref 0.00–0.07)
Basophils Absolute: 0 10*3/uL (ref 0.0–0.1)
Basophils Relative: 1 %
Eosinophils Absolute: 0.3 10*3/uL (ref 0.0–0.5)
Eosinophils Relative: 4 %
HCT: 37.6 % (ref 36.0–46.0)
Hemoglobin: 12.4 g/dL (ref 12.0–15.0)
Immature Granulocytes: 0 %
Lymphocytes Relative: 27 %
Lymphs Abs: 1.8 10*3/uL (ref 0.7–4.0)
MCH: 28.1 pg (ref 26.0–34.0)
MCHC: 33 g/dL (ref 30.0–36.0)
MCV: 85.1 fL (ref 80.0–100.0)
Monocytes Absolute: 0.5 10*3/uL (ref 0.1–1.0)
Monocytes Relative: 7 %
Neutro Abs: 4 10*3/uL (ref 1.7–7.7)
Neutrophils Relative %: 61 %
Platelet Count: 239 10*3/uL (ref 150–400)
RBC: 4.42 MIL/uL (ref 3.87–5.11)
RDW: 12.9 % (ref 11.5–15.5)
WBC Count: 6.6 10*3/uL (ref 4.0–10.5)
nRBC: 0 % (ref 0.0–0.2)

## 2021-03-13 LAB — FERRITIN: Ferritin: 51 ng/mL (ref 11–307)

## 2021-03-13 LAB — RETICULOCYTES
Immature Retic Fract: 12.9 % (ref 2.3–15.9)
RBC.: 4.42 MIL/uL (ref 3.87–5.11)
Retic Count, Absolute: 95 10*3/uL (ref 19.0–186.0)
Retic Ct Pct: 2.2 % (ref 0.4–3.1)

## 2021-03-13 LAB — CMP (CANCER CENTER ONLY)
ALT: 8 U/L (ref 0–44)
AST: 14 U/L — ABNORMAL LOW (ref 15–41)
Albumin: 4 g/dL (ref 3.5–5.0)
Alkaline Phosphatase: 51 U/L (ref 38–126)
Anion gap: 9 (ref 5–15)
BUN: 15 mg/dL (ref 6–20)
CO2: 21 mmol/L — ABNORMAL LOW (ref 22–32)
Calcium: 9.4 mg/dL (ref 8.9–10.3)
Chloride: 107 mmol/L (ref 98–111)
Creatinine: 0.79 mg/dL (ref 0.44–1.00)
GFR, Estimated: >60 mL/min (ref >60)
Glucose, Bld: 111 mg/dL — ABNORMAL HIGH (ref 70–99)
Potassium: 3.9 mmol/L (ref 3.5–5.1)
Sodium: 137 mmol/L (ref 135–145)
Total Bilirubin: 0.3 mg/dL (ref 0.3–1.2)
Total Protein: 6.6 g/dL (ref 6.5–8.1)

## 2021-03-13 LAB — LACTATE DEHYDROGENASE: LDH: 138 U/L (ref 98–192)

## 2021-03-13 LAB — SAVE SMEAR(SSMR), FOR PROVIDER SLIDE REVIEW

## 2021-03-13 LAB — ANTITHROMBIN III: AntiThromb III Func: 107 % (ref 75–120)

## 2021-03-13 NOTE — Progress Notes (Signed)
Hematology/Oncology Consultation   Name: Annette Lewis      MRN: [id number]    Location: Room/bed info not found  Date: 03/13/2021 Time:8:49 AM   REFERRING PHYSICIAN:  Mitzi Hansen, NP  REASON FOR CONSULT:  Anemia and pulmonary emboli   DIAGNOSIS:  Bilateral pulmonary emboli with right heart strain Anemia   HISTORY OF PRESENT ILLNESS: Annette Lewis is a very pleasant 45 yo African American female with recent diagnosis of bilateral pulmonary emboli with right heart strain. She is doing well on Eliquis and is taking as prescribed. She had been on several long flights travelling to [place].  She is feeling much better now and has no complaints at this time.  She has previous history of DVT in the right lower extremity 4 years ago treated with Xarelto for 6 months. She states that at the time she had injured her leg and had also been travelling frequently on flights.  She denies being on estrogen based birth control prior to either event.  She has never been a smoker.  Her maternal also has history of DVT and is on lifelong anticoagulation with Eliquis.  Her cycle is regular but heavy on anticoagulation. No other blood loss noted. No bruising or petechiae.  No personal history of cancer. Her maternal great aunt has history of breast cancer.  No history of thyroid disease or diabetes.  No fever, chills, n/v, cough, rash, dizziness, SOB, chest pain, palpitations, abdominal pain or changes in bowel or bladder habits.  No swelling, tenderness, numbness or tingling in her extremities at this time. She occasionally his tingling in her hands and feet with certain positions and after eating carbs.  No recreational drug use. She has the rare alcoholic beverage socially.  She has maintained a good appetite and is staying well hydrated. Her weight is stable at 225 lbs.  She works as an Art gallery manager for [employer].   ROS: All other 10 point review of systems is negative.   PAST MEDICAL HISTORY:   Past Medical  History:  Diagnosis Date   DVT (deep venous thrombosis) (HCC)    Pulmonary embolism (HCC)     ALLERGIES: No Known Allergies    MEDICATIONS:  Current Outpatient Medications on File Prior to Visit  Medication Sig Dispense Refill   apixaban (ELIQUIS) 5 MG TABS tablet Take 2 tablets (10 mg total) by mouth 2 (two) times daily for 6 days. 24 tablet 0   apixaban (ELIQUIS) 5 MG TABS tablet Take 1 tablet (5 mg total) by mouth 2 (two) times daily. 60 tablet 0   cholecalciferol (VITAMIN D3) 25 MCG (1000 UNIT) tablet Take 5,000 Units by mouth daily.     magnesium 30 MG tablet Take 30 mg by mouth 2 (two) times daily.     Multiple Vitamin (MULTIVITAMIN) tablet Take 1 tablet by mouth daily.     POTASSIUM CHLORIDE PO Take 15 mLs by mouth daily. Potassium salt.     terbinafine (LAMISIL) 250 MG tablet Take 250 mg by mouth daily.     No current facility-administered medications on file prior to visit.     PAST SURGICAL HISTORY Past Surgical History:  Procedure Laterality Date   CESAREAN SECTION     IR ANGIOGRAM PULMONARY BILATERAL SELECTIVE  01/27/2021   IR INFUSION THROMBOL ARTERIAL INITIAL (MS)  01/27/2021   IR US GUIDE VASC ACCESS RIGHT  01/27/2021   IR US GUIDE VASC ACCESS RIGHT  01/27/2021    FAMILY HISTORY: No family history on file.  SOCIAL HISTORY:  reports that she has never smoked. She has never used smokeless tobacco. She reports previous alcohol use. She reports that she does not use drugs.  PERFORMANCE STATUS: The patient's performance status is 1 - Symptomatic but completely ambulatory  PHYSICAL EXAM: Most Recent Vital Signs: There were no vitals taken for this visit. BP 123/64 (BP Location: Left Arm, Patient Position: Sitting)   Pulse 87   Temp 98.4 F (36.9 C) (Oral)   Resp 18   Ht 5\' 3"  (1.6 m)   Wt 225 lb (102.1 kg)   SpO2 100%   BMI 39.86 kg/m   General Appearance:    Alert, cooperative, no distress, appears stated age  Head:    Normocephalic, without obvious  abnormality, atraumatic  Eyes:    PERRL, conjunctiva/corneas clear, EOM's intact, fundi    benign, both eyes        Throat:   Lips, mucosa, and tongue normal; teeth and gums normal  Neck:   Supple, symmetrical, trachea midline, no adenopathy;    thyroid:  no enlargement/tenderness/nodules; no carotid   bruit or JVD  Back:     Symmetric, no curvature, ROM normal, no CVA tenderness  Lungs:     Clear to auscultation bilaterally, respirations unlabored  Chest Wall:    No tenderness or deformity   Heart:    Regular rate and rhythm, S1 and S2 normal, no murmur, rub   or gallop     Abdomen:     Soft, non-tender, bowel sounds active all four quadrants,    no masses, no organomegaly        Extremities:   Extremities normal, atraumatic, no cyanosis or edema  Pulses:   2+ and symmetric all extremities  Skin:   Skin color, texture, turgor normal, no rashes or lesions  Lymph nodes:   Cervical, supraclavicular, and axillary nodes normal  Neurologic:   CNII-XII intact, normal strength, sensation and reflexes    throughout    LABORATORY DATA:  Results for orders placed or performed in visit on 03/13/21 (from the past 48 hour(s))  Save Smear (SSMR)     Status: None   Collection Time: 03/13/21  8:21 AM  Result Value Ref Range   Smear Review SMEAR STAINED AND AVAILABLE FOR REVIEW     Comment: Performed at [facility] Lab at [facility], [address], [place], [place]  Reticulocytes     Status: None   Collection Time: 03/13/21  8:21 AM  Result Value Ref Range   Retic Ct Pct 2.2 0.4 - 3.1 %   RBC. 4.42 3.87 - 5.11 MIL/uL   Retic Count, Absolute 95.0 19.0 - 186.0 K/uL   Immature Retic Fract 12.9 2.3 - 15.9 %    Comment: Performed at [facility] Lab at [facility], [address] [place], [place]  CBC with Differential (Cancer Center Only)     Status: None   Collection Time: 03/13/21  8:21 AM  Result Value Ref Range   WBC  Count 6.6 4.0 - 10.5 K/uL   RBC 4.42 3.87 - 5.11 MIL/uL   Hemoglobin 12.4 12.0 - 15.0 g/dL   HCT 05/14/21 35.4 - 65.6 %   MCV 85.1 80.0 - 100.0 fL   MCH 28.1 26.0 - 34.0 pg   MCHC 33.0 30.0 - 36.0 g/dL   RDW 81.2 75.1 - 70.0 %   Platelet Count 239 150 - 400 K/uL   nRBC 0.0 0.0 -  0.2 %   Neutrophils Relative % 61 %   Neutro Abs 4.0 1.7 - 7.7 K/uL   Lymphocytes Relative 27 %   Lymphs Abs 1.8 0.7 - 4.0 K/uL   Monocytes Relative 7 %   Monocytes Absolute 0.5 0.1 - 1.0 K/uL   Eosinophils Relative 4 %   Eosinophils Absolute 0.3 0.0 - 0.5 K/uL   Basophils Relative 1 %   Basophils Absolute 0.0 0.0 - 0.1 K/uL   Immature Granulocytes 0 %   Abs Immature Granulocytes 0.02 0.00 - 0.07 K/uL    Comment: Performed at [facility] at [facility], [address] [place], [place] [address]      RADIOGRAPHY: No results found.     PATHOLOGY: None  ASSESSMENT/PLAN: Ms. Lofton is a very pleasant 45 yo [place] American female with recent diagnosis of bilateral pulmonary emboli with right heart strain. She also has history of right lower extremity DVT 4 years ago.  Hypercoag work up is pending.  She is doing well on Eliquis and will continue her same regimen.  We will get her set up for repeat CT angio in late August. She will be having a repeat ECHO with pulmonology in November.  Follow-up in 4 months.   All questions were answered. The patient knows to call the clinic with any problems, questions or concerns. We can certainly see the patient much sooner if necessary.  The patient was discussed with Dr. Myna Hidalgo and he is in agreement with the aforementioned.   Emeline Gins, NP

## 2021-03-14 ENCOUNTER — Telehealth: Payer: Self-pay

## 2021-03-14 LAB — CARDIOLIPIN ANTIBODIES, IGG, IGM, IGA
Anticardiolipin IgA: <9 APL U/mL (ref 0–11)
Anticardiolipin IgG: 12 GPL U/mL (ref 0–14)
Anticardiolipin IgM: 16 MPL U/mL — ABNORMAL HIGH (ref 0–12)

## 2021-03-14 LAB — BETA-2-GLYCOPROTEIN I ABS, IGG/M/A
Beta-2 Glyco I IgG: <9 GPI IgG units (ref 0–20)
Beta-2-Glycoprotein I IgA: <9 GPI IgA units (ref 0–25)
Beta-2-Glycoprotein I IgM: <9 GPI IgM units (ref 0–32)

## 2021-03-14 LAB — PROTEIN S ACTIVITY: Protein S Activity: 75 % (ref 63–140)

## 2021-03-14 LAB — PROTEIN C, TOTAL: Protein C, Total: 103 % (ref 60–150)

## 2021-03-14 LAB — HOMOCYSTEINE: Homocysteine: 7.6 umol/L (ref 0.0–14.5)

## 2021-03-14 LAB — LUPUS ANTICOAGULANT PANEL
DRVVT: 38.7 s (ref 0.0–47.0)
PTT Lupus Anticoagulant: 35.1 s (ref 0.0–51.9)

## 2021-03-14 LAB — PROTEIN C ACTIVITY: Protein C Activity: 107 % (ref 73–180)

## 2021-03-14 LAB — PROTEIN S, TOTAL: Protein S Ag, Total: 94 % (ref 60–150)

## 2021-03-14 NOTE — Telephone Encounter (Signed)
No 03/13/21 los noted   [employer]

## 2021-03-17 ENCOUNTER — Telehealth: Payer: Self-pay | Admitting: Family

## 2021-03-17 LAB — FACTOR 5 LEIDEN

## 2021-03-17 LAB — PROTHROMBIN GENE MUTATION

## 2021-03-17 NOTE — Telephone Encounter (Signed)
I was able to speak with the patient and go over her hyper coag panel as well as iron studies. She had a mildly elevated anticardiolipin IgM level which we will continue to follow-up. Her panel was otherwise negative.  She was slightly iron deficient and will start taking a daily oral iron supplement. No other questions at this time. We will plan to see her again in 4 months for follow-up. Patient appreciative of call.

## 2021-03-20 ENCOUNTER — Telehealth: Payer: Self-pay

## 2021-03-20 NOTE — Telephone Encounter (Signed)
Called pt per sch message,late 7/11 los, pt aware of all appts ant that she will get a call with her ct

## 2021-05-04 ENCOUNTER — Other Ambulatory Visit (HOSPITAL_BASED_OUTPATIENT_CLINIC_OR_DEPARTMENT_OTHER): Payer: 59

## 2021-07-17 ENCOUNTER — Inpatient Hospital Stay (HOSPITAL_BASED_OUTPATIENT_CLINIC_OR_DEPARTMENT_OTHER): Payer: 59 | Admitting: Family

## 2021-07-17 ENCOUNTER — Encounter: Payer: Self-pay | Admitting: Family

## 2021-07-17 ENCOUNTER — Inpatient Hospital Stay: Payer: 59 | Attending: Hematology & Oncology

## 2021-07-17 ENCOUNTER — Other Ambulatory Visit: Payer: Self-pay

## 2021-07-17 VITALS — BP 111/61 | HR 78 | Temp 98.4°F | Resp 18 | Ht 63.0 in | Wt 208.0 lb

## 2021-07-17 DIAGNOSIS — R768 Other specified abnormal immunological findings in serum: Secondary | ICD-10-CM | POA: Diagnosis not present

## 2021-07-17 DIAGNOSIS — D573 Sickle-cell trait: Secondary | ICD-10-CM | POA: Insufficient documentation

## 2021-07-17 DIAGNOSIS — I2601 Septic pulmonary embolism with acute cor pulmonale: Secondary | ICD-10-CM | POA: Diagnosis not present

## 2021-07-17 DIAGNOSIS — D6859 Other primary thrombophilia: Secondary | ICD-10-CM

## 2021-07-17 DIAGNOSIS — E611 Iron deficiency: Secondary | ICD-10-CM | POA: Insufficient documentation

## 2021-07-17 DIAGNOSIS — I2699 Other pulmonary embolism without acute cor pulmonale: Secondary | ICD-10-CM | POA: Insufficient documentation

## 2021-07-17 DIAGNOSIS — Z7901 Long term (current) use of anticoagulants: Secondary | ICD-10-CM | POA: Insufficient documentation

## 2021-07-17 LAB — CBC WITH DIFFERENTIAL (CANCER CENTER ONLY)
Abs Immature Granulocytes: 0.01 10*3/uL (ref 0.00–0.07)
Basophils Absolute: 0 10*3/uL (ref 0.0–0.1)
Basophils Relative: 1 %
Eosinophils Absolute: 0.1 10*3/uL (ref 0.0–0.5)
Eosinophils Relative: 1 %
HCT: 37.6 % (ref 36.0–46.0)
Hemoglobin: 12.4 g/dL (ref 12.0–15.0)
Immature Granulocytes: 0 %
Lymphocytes Relative: 29 %
Lymphs Abs: 1.5 10*3/uL (ref 0.7–4.0)
MCH: 28.2 pg (ref 26.0–34.0)
MCHC: 33 g/dL (ref 30.0–36.0)
MCV: 85.6 fL (ref 80.0–100.0)
Monocytes Absolute: 0.5 10*3/uL (ref 0.1–1.0)
Monocytes Relative: 10 %
Neutro Abs: 3 10*3/uL (ref 1.7–7.7)
Neutrophils Relative %: 59 %
Platelet Count: 230 10*3/uL (ref 150–400)
RBC: 4.39 MIL/uL (ref 3.87–5.11)
RDW: 13.4 % (ref 11.5–15.5)
WBC Count: 5.1 10*3/uL (ref 4.0–10.5)
nRBC: 0 % (ref 0.0–0.2)

## 2021-07-17 LAB — CMP (CANCER CENTER ONLY)
ALT: 13 U/L (ref 0–44)
AST: 27 U/L (ref 15–41)
Albumin: 4.2 g/dL (ref 3.5–5.0)
Alkaline Phosphatase: 50 U/L (ref 38–126)
Anion gap: 16 — ABNORMAL HIGH (ref 5–15)
BUN: 7 mg/dL (ref 6–20)
CO2: 16 mmol/L — ABNORMAL LOW (ref 22–32)
Calcium: 9.7 mg/dL (ref 8.9–10.3)
Chloride: 105 mmol/L (ref 98–111)
Creatinine: 0.8 mg/dL (ref 0.44–1.00)
GFR, Estimated: >60 mL/min (ref >60)
Glucose, Bld: 60 mg/dL — ABNORMAL LOW (ref 70–99)
Potassium: 4 mmol/L (ref 3.5–5.1)
Sodium: 137 mmol/L (ref 135–145)
Total Bilirubin: 0.5 mg/dL (ref 0.3–1.2)
Total Protein: 6.6 g/dL (ref 6.5–8.1)

## 2021-07-17 MED ORDER — FOLIC ACID 1 MG PO TABS
1.0000 mg | ORAL_TABLET | Freq: Every day | ORAL | 11 refills | Status: DC
Start: 2021-07-17 — End: 2022-05-23

## 2021-07-17 MED ORDER — FOLIC ACID 1 MG PO TABS
1.0000 mg | ORAL_TABLET | Freq: Every day | ORAL | 11 refills | Status: DC
Start: 2021-07-17 — End: 2021-07-17

## 2021-07-17 NOTE — Progress Notes (Signed)
Hematology and Oncology Follow Up Visit  Annette Lewis [id number] 1975-10-28 45 y.o. 07/17/2021   Principle Diagnosis:  Bilateral pulmonary emboli Slightly elevated anticardiolipin IgM level Iron deficiency  Sickle cell trait  Current Therapy:   Eliquis 5 mg PO BID   Interim History:  Annette Lewis is here today for follow-up. She is doing well on Eliquis and taking as prescribed.  She states that her cycle is regular but has a heavy flow on anticoagulation.  No other blood loss noted. No bruising or petechiae.  She states that her pulmonologist Dr. Irena Cords will be repeating her CT angio on 07/24/21 and she will have them send [place] the report.  No fever, chills, n/v, cough, rash, dizziness, SOB, chest pain, palpitations, abdominal pain or changes in bowel or bladder habits.   No swelling, tenderness, numbness or tingling in her extremities at this time. She has occasional positional tingling in her hands when sleeping.  No falls or syncope to report.  She has maintained a good appetite and is staying well hydrated. Her weight is stable at 208 lbs.   ECOG Performance Status: 1 - Symptomatic but completely ambulatory  Medications:  Allergies as of 07/17/2021   No Known Allergies      Medication List        Accurate as of July 17, 2021  2:48 PM. If you have any questions, ask your nurse or doctor.          STOP taking these medications    terbinafine 250 MG tablet Commonly known as: LAMISIL Stopped by: Eileen Stanford, NP       TAKE these medications    apixaban 5 MG Tabs tablet Commonly known as: ELIQUIS Take 1 tablet (5 mg total) by mouth 2 (two) times daily.   cholecalciferol 25 MCG (1000 UNIT) tablet Commonly known as: VITAMIN D3 Take 5,000 Units by mouth daily.   magnesium 30 MG tablet Take 30 mg by mouth 2 (two) times daily.   multivitamin tablet Take 1 tablet by mouth daily.   POTASSIUM CHLORIDE PO Take 15 mLs by mouth daily. Potassium salt.         Allergies: No Known Allergies  Past Medical History, Surgical history, Social history, and Family History were reviewed and updated.  Review of Systems: All other 10 point review of systems is negative.   Physical Exam:  height is 5\' 3"  (1.6 m) and weight is 208 lb (94.3 kg). Her oral temperature is 98.4 F (36.9 C). Her blood pressure is 111/61 and her pulse is 78. Her respiration is 18 and oxygen saturation is 100%.   Wt Readings from Last 3 Encounters:  07/17/21 208 lb (94.3 kg)  03/13/21 225 lb (102.1 kg)  03/10/21 224 lb (101.6 kg)    Ocular: Sclerae unicteric, pupils equal, round and reactive to light Ear-nose-throat: Oropharynx clear, dentition fair Lymphatic: No cervical or supraclavicular adenopathy Lungs no rales or rhonchi, good excursion bilaterally Heart regular rate and rhythm, no murmur appreciated Abd soft, nontender, positive bowel sounds MSK no focal spinal tenderness, no joint edema Neuro: non-focal, well-oriented, appropriate affect Breasts: Deferred   Lab Results  Component Value Date   WBC 5.1 07/17/2021   HGB 12.4 07/17/2021   HCT 37.6 07/17/2021   MCV 85.6 07/17/2021   PLT 230 07/17/2021   Lab Results  Component Value Date   FERRITIN 51 03/13/2021   IRON 52 03/13/2021   TIBC 368 03/13/2021   UIBC 315 03/13/2021   IRONPCTSAT 14 (L) 03/13/2021  Lab Results  Component Value Date   RETICCTPCT 2.2 03/13/2021   RBC 4.39 07/17/2021   No results found for: KPAFRELGTCHN, LAMBDASER, KAPLAMBRATIO No results found for: IGGSERUM, IGA, IGMSERUM No results found for: Dorene Ar, A1GS, A2GS, Karn Pickler, SPEI   Chemistry      Component Value Date/Time   NA 137 07/17/2021 1346   K 4.0 07/17/2021 1346   CL 105 07/17/2021 1346   CO2 16 (L) 07/17/2021 1346   BUN 7 07/17/2021 1346   CREATININE 0.80 07/17/2021 1346      Component Value Date/Time   CALCIUM 9.7 07/17/2021 1346   ALKPHOS 50 07/17/2021 1346   AST 27  07/17/2021 1346   ALT 13 07/17/2021 1346   BILITOT 0.5 07/17/2021 1346       Impression and Plan: Annette Lewis is a very pleasant 45 yo African American female with recent diagnosis of bilateral pulmonary emboli with right heart strain. She also has history of right lower extremity DVT 4 years ago.  Hyper coag report was unremarkable. She had a mildly elevated anticardiolipin IgM level.  She will continue her same regimen with Elquis full dose for 1 year and then we will transition her to maintenance dosing.  We will be on the look out for her repeat CT angio report.  Iron studies are pending.  Follow-up in 6 months.  She can contact our office with any questions or concerns.   Eileen Stanford, NP 11/14/20222:48 PM

## 2021-07-18 ENCOUNTER — Telehealth: Payer: Self-pay | Admitting: *Deleted

## 2021-07-18 LAB — IRON AND TIBC
Iron: 96 ug/dL (ref 41–142)
Saturation Ratios: 35 % (ref 21–57)
TIBC: 274 ug/dL (ref 236–444)
UIBC: 178 ug/dL (ref 120–384)

## 2021-07-18 LAB — FERRITIN: Ferritin: 94 ng/mL (ref 11–307)

## 2021-07-18 NOTE — Telephone Encounter (Signed)
Per 07/17/21 los - called and gave upcoming appointments - confirmed 

## 2021-07-19 LAB — CARDIOLIPIN ANTIBODIES, IGG, IGM, IGA
Anticardiolipin IgA: <9 APL U/mL (ref 0–11)
Anticardiolipin IgG: <9 GPL U/mL (ref 0–14)
Anticardiolipin IgM: <9 MPL U/mL (ref 0–12)

## 2021-07-24 ENCOUNTER — Ambulatory Visit (HOSPITAL_COMMUNITY): Payer: 59 | Attending: Cardiovascular Disease

## 2021-07-24 ENCOUNTER — Other Ambulatory Visit: Payer: Self-pay

## 2021-07-24 DIAGNOSIS — I2602 Saddle embolus of pulmonary artery with acute cor pulmonale: Secondary | ICD-10-CM

## 2021-07-24 LAB — ECHOCARDIOGRAM COMPLETE
Area-P 1/2: 4.36 cm2
S' Lateral: 2.7 cm

## 2021-08-11 ENCOUNTER — Encounter: Payer: Self-pay | Admitting: Pulmonary Disease

## 2021-08-11 ENCOUNTER — Other Ambulatory Visit: Payer: Self-pay

## 2021-08-11 ENCOUNTER — Ambulatory Visit (INDEPENDENT_AMBULATORY_CARE_PROVIDER_SITE_OTHER): Payer: 59 | Admitting: Pulmonary Disease

## 2021-08-11 VITALS — BP 116/72 | HR 77 | Ht 63.0 in | Wt 205.0 lb

## 2021-08-11 DIAGNOSIS — I2602 Saddle embolus of pulmonary artery with acute cor pulmonale: Secondary | ICD-10-CM

## 2021-08-11 NOTE — Progress Notes (Signed)
Synopsis: Referred in July 2022 for Hospital follow up for PE  Subjective:   PATIENT ID: Annette Lewis GENDER: female DOB: 25-Jan-1976, MRN: [id number]  HPI  Chief Complaint  Patient presents with   Follow-up    5 mo f/u for PE, completed echo in November   Annette Lewis is a 45 year old woman, never smoker with history of DVT and recent admission for saddle pulmonary embolus who returns to pulmonary clinic for follow up.   She remains on Eliquis therapy and denies any issues with bleeding.  She denies any shortness of breath or chest pain.  She reports her health has returned to baseline.  She is being followed by hematology clinic for anticoagulation.  Repeat echocardiogram shows return of normal right heart function.  OV 03/10/21 She was admitted 5/27-5/30 where she presented with shortness of breath and was found to have acute pulmonary embolus with right heart strain noted on echocardiogram.  She had catheter directed thrombolysis performed by interventional radiology.  She denies any issues at the femoral insertion site at this time.  She reports she is doing well on her Eliquis therapy and denies any bleeding issues at this time.  She denies any shortness of breath, lightheadedness, dizziness or syncopal/presyncopal episodes since admission.  She denies any chest pain or hemoptysis.  She denies any lower extremity edema.  She denies any nighttime awakenings with shortness of breath.  She denies any cough or wheezing.  Past Medical History:  Diagnosis Date   DVT (deep venous thrombosis) (HCC)    Pulmonary embolism (HCC)      No family history on file.   Social History   Socioeconomic History   Marital status: Married    Spouse name: Not on file   Number of children: Not on file   Years of education: Not on file   Highest education level: Not on file  Occupational History   Not on file  Tobacco Use   Smoking status: Never   Smokeless tobacco: Never  Vaping Use    Vaping Use: Never used  Substance and Sexual Activity   Alcohol use: Not Currently   Drug use: Never   Sexual activity: Not on file  Other Topics Concern   Not on file  Social History Narrative   Not on file   Social Determinants of Health   Financial Resource Strain: Not on file  Food Insecurity: Not on file  Transportation Needs: Not on file  Physical Activity: Not on file  Stress: Not on file  Social Connections: Not on file  Intimate Partner Violence: Not on file     No Known Allergies   Outpatient Medications Prior to Visit  Medication Sig Dispense Refill   apixaban (ELIQUIS) 5 MG TABS tablet Take 1 tablet (5 mg total) by mouth 2 (two) times daily. 60 tablet 0   cholecalciferol (VITAMIN D3) 25 MCG (1000 UNIT) tablet Take 5,000 Units by mouth daily.     Liraglutide -Weight Management (SAXENDA) 18 MG/3ML SOPN 0.6mg  daily for 7 days, 1.2mg  daily for 7 days, 1.8mg  daily for 7 days, 2.4mg  daily for 7 days, then 3mg  daily maintenance dose     magnesium 30 MG tablet Take 30 mg by mouth 2 (two) times daily.     Multiple Vitamin (MULTIVITAMIN) tablet Take 1 tablet by mouth daily.     POTASSIUM CHLORIDE PO Take 15 mLs by mouth daily. Potassium salt.     folic acid (FOLVITE) 1 MG tablet Take 1 tablet (  1 mg total) by mouth daily. (Patient not taking: Reported on 08/11/2021) 30 tablet 11   No facility-administered medications prior to visit.   Review of Systems  Constitutional:  Negative for chills, fever, malaise/fatigue and weight loss.  HENT:  Negative for congestion, sinus pain and sore throat.   Eyes: Negative.   Respiratory:  Negative for cough, hemoptysis, sputum production, shortness of breath and wheezing.   Cardiovascular:  Negative for chest pain, palpitations, orthopnea, claudication and leg swelling.  Gastrointestinal:  Negative for abdominal pain, heartburn, nausea and vomiting.  Genitourinary: Negative.   Musculoskeletal:  Negative for joint pain and myalgias.  Skin:   Negative for rash.  Neurological:  Negative for weakness.  Endo/Heme/Allergies: Negative.   Psychiatric/Behavioral: Negative.     Objective:   Vitals:   08/11/21 1158  BP: 116/72  Pulse: 77  SpO2: 99%  Weight: 205 lb (93 kg)  Height: 5\' 3"  (1.6 m)   Physical Exam Constitutional:      General: She is not in acute distress.    Appearance: She is obese. She is not ill-appearing.  HENT:     Head: Normocephalic and atraumatic.  Eyes:     General: No scleral icterus.    Conjunctiva/sclera: Conjunctivae normal.  Cardiovascular:     Rate and Rhythm: Normal rate and regular rhythm.     Pulses: Normal pulses.     Heart sounds: Normal heart sounds. No murmur heard. Pulmonary:     Effort: Pulmonary effort is normal.     Breath sounds: Normal breath sounds. No wheezing, rhonchi or rales.  Musculoskeletal:     Right lower leg: No edema.     Left lower leg: No edema.  Skin:    General: Skin is warm and dry.  Neurological:     General: No focal deficit present.     Mental Status: She is alert.   CBC    Component Value Date/Time   WBC 5.1 07/17/2021 1346   WBC 5.0 01/30/2021 0244   RBC 4.39 07/17/2021 1346   HGB 12.4 07/17/2021 1346   HCT 37.6 07/17/2021 1346   PLT 230 07/17/2021 1346   MCV 85.6 07/17/2021 1346   MCH 28.2 07/17/2021 1346   MCHC 33.0 07/17/2021 1346   RDW 13.4 07/17/2021 1346   LYMPHSABS 1.5 07/17/2021 1346   MONOABS 0.5 07/17/2021 1346   EOSABS 0.1 07/17/2021 1346   BASOSABS 0.0 07/17/2021 1346   BMP Latest Ref Rng & Units 07/17/2021 03/13/2021 01/30/2021  Glucose 70 - 99 mg/dL 02/01/2021) 40(J) 93  BUN 6 - 20 mg/dL 7 15 12   Creatinine 0.44 - 1.00 mg/dL 811(B 1.47  Sodium 135 - 145 mmol/L 137 137 141  Potassium 3.5 - 5.1 mmol/L 4.0 3.9 3.9  Chloride 98 - 111 mmol/L 105 107 110  CO2 22 - 32 mmol/L 16(L) 21(L) 24  Calcium 8.9 - 10.3 mg/dL 9.7 9.4 8.9   Chest imaging: CTA Chest 01/26/21 Saddle embolus straddling the right and left main pulmonary  arteries. Extension of thrombus into right upper proximal segmental vessels, right middle lobe segmental and subsegmental vessels, right inter lobar artery and right lower lobe segmental and subsegmental pulmonary vessels. On the left, thrombus extends into left upper and lower lobe segmental and subsegmental vessels. Thrombus within descending left pulmonary artery.  PFT: No flowsheet data found.  Echo 01/27/2021: 1. Severe RV dilation and moderately reduced function consistent with cor  pulmonale in setting of acute PE.   2. Left ventricular ejection fraction,  by estimation, is 60 to 65%. The  left ventricle has normal function. The left ventricle has no regional  wall motion abnormalities. Left ventricular diastolic parameters were  normal. There is the interventricular  septum is flattened in systole and diastole, consistent with right  ventricular pressure and volume overload.   3. Right ventricular systolic function is moderately reduced. The right  ventricular size is severely enlarged. There is normal pulmonary artery  systolic pressure. The estimated right ventricular systolic pressure is  29.7 mmHg.   4. Right atrial size was mildly dilated.   5. The mitral valve is grossly normal. No evidence of mitral valve  regurgitation. No evidence of mitral stenosis.   6. The aortic valve is tricuspid. Aortic valve regurgitation is not  visualized. No aortic stenosis is present.   7. The inferior vena cava is dilated in size with >50% respiratory  variability, suggesting right atrial pressure of 8 mmHg.    Assessment & Plan:   Acute saddle pulmonary embolism with acute cor pulmonale (HCC)  Discussion: Annette Lewis is a 45 year old woman, never smoker with history of DVT and recent admission for saddle pulmonary embolus who returns to pulmonary clinic for follow up.   Given that she had a DVT in the past and most recently a pulmonary embolus with right heart strain she is to  remain on Eliquis therapy indefinitely unless she develops a severe bleeding risk. Her anticoagulation is being managed by Hematology.   Echocardiogram 07/24/21 shows return of normal right heart function. No further testing recommended at this time. She does not require CTA Chest for follow up of clot burden as her symptoms have resolved with anticoagulation.  Follow up as needed.  Melody Comas, MD [facility] Office: [phone]    Current Outpatient Medications:    apixaban (ELIQUIS) 5 MG TABS tablet, Take 1 tablet (5 mg total) by mouth 2 (two) times daily., Disp: 60 tablet, Rfl: 0   cholecalciferol (VITAMIN D3) 25 MCG (1000 UNIT) tablet, Take 5,000 Units by mouth daily., Disp: , Rfl:    Liraglutide -Weight Management (SAXENDA) 18 MG/3ML SOPN, 0.6mg  daily for 7 days, 1.2mg  daily for 7 days, 1.8mg  daily for 7 days, 2.4mg  daily for 7 days, then 3mg  daily maintenance dose, Disp: , Rfl:    magnesium 30 MG tablet, Take 30 mg by mouth 2 (two) times daily., Disp: , Rfl:    Multiple Vitamin (MULTIVITAMIN) tablet, Take 1 tablet by mouth daily., Disp: , Rfl:    POTASSIUM CHLORIDE PO, Take 15 mLs by mouth daily. Potassium salt., Disp: , Rfl:    folic acid (FOLVITE) 1 MG tablet, Take 1 tablet (1 mg total) by mouth daily. (Patient not taking: Reported on 08/11/2021), Disp: 30 tablet, Rfl: 11

## 2021-08-11 NOTE — Patient Instructions (Signed)
Continue on eliquis therapy 5mg  twice daily with further management per the Hematology team.   No plans for repeat CTA Chest scan as discussed.   Your heart ultrasound shows return of normal heart function.   Follow up as needed.

## 2021-08-29 ENCOUNTER — Telehealth: Payer: Self-pay | Admitting: Pulmonary Disease

## 2021-08-29 ENCOUNTER — Other Ambulatory Visit: Payer: Self-pay | Admitting: Family

## 2021-08-29 ENCOUNTER — Telehealth: Payer: Self-pay

## 2021-08-29 ENCOUNTER — Other Ambulatory Visit: Payer: Self-pay

## 2021-08-29 MED ORDER — APIXABAN 5 MG PO TABS: 5.0000 mg | ORAL_TABLET | Freq: Two times a day (BID) | ORAL | 6 refills | Status: DC

## 2021-08-29 NOTE — Telephone Encounter (Signed)
Pt called in stating that her Eliquis Rx was sent in incorrectly to her pharmacy and she is not sure who refilled this. Per her chart, we did not fill the Rx however Maralyn Sago did verify that pt needs to be on Eliquis 5 mg BID x 1 year. Maralyn Sago is ok refilling this Rx unless her pulmonologist is already filling this for her. Pt to check and will let us know.

## 2021-08-29 NOTE — Telephone Encounter (Signed)
Called and spoke with patient. She stated that the Eliquis RX had been changed to 2 5mg  tablets twice a day. Because of the way the RX is written, her insurance will not pay for the Eliquis.  I advised her that Dr. did not send in the RX for her and the only RX on file was sent in back in May 2022. She is aware that per the last OV note she is have her Eliquis managed by hematology and not our office. She will reach out to them.   Nothing further needed at time of call.

## 2022-01-15 ENCOUNTER — Inpatient Hospital Stay (HOSPITAL_BASED_OUTPATIENT_CLINIC_OR_DEPARTMENT_OTHER): Payer: 59 | Admitting: Hematology & Oncology

## 2022-01-15 ENCOUNTER — Inpatient Hospital Stay: Payer: 59 | Attending: Hematology & Oncology

## 2022-01-15 ENCOUNTER — Encounter: Payer: Self-pay | Admitting: Hematology & Oncology

## 2022-01-15 VITALS — BP 129/85 | HR 95 | Temp 98.6°F | Resp 20 | Wt 205.8 lb

## 2022-01-15 DIAGNOSIS — R768 Other specified abnormal immunological findings in serum: Secondary | ICD-10-CM | POA: Insufficient documentation

## 2022-01-15 DIAGNOSIS — D573 Sickle-cell trait: Secondary | ICD-10-CM | POA: Diagnosis present

## 2022-01-15 DIAGNOSIS — R079 Chest pain, unspecified: Secondary | ICD-10-CM | POA: Diagnosis not present

## 2022-01-15 DIAGNOSIS — Z7901 Long term (current) use of anticoagulants: Secondary | ICD-10-CM | POA: Diagnosis not present

## 2022-01-15 DIAGNOSIS — D6859 Other primary thrombophilia: Secondary | ICD-10-CM

## 2022-01-15 DIAGNOSIS — I2601 Septic pulmonary embolism with acute cor pulmonale: Secondary | ICD-10-CM

## 2022-01-15 DIAGNOSIS — E611 Iron deficiency: Secondary | ICD-10-CM | POA: Diagnosis not present

## 2022-01-15 DIAGNOSIS — I2699 Other pulmonary embolism without acute cor pulmonale: Secondary | ICD-10-CM | POA: Insufficient documentation

## 2022-01-15 LAB — CMP (CANCER CENTER ONLY)
ALT: 10 U/L (ref 0–44)
AST: 13 U/L — ABNORMAL LOW (ref 15–41)
Albumin: 4.1 g/dL (ref 3.5–5.0)
Alkaline Phosphatase: 50 U/L (ref 38–126)
Anion gap: 8 (ref 5–15)
BUN: 15 mg/dL (ref 6–20)
CO2: 24 mmol/L (ref 22–32)
Calcium: 9.5 mg/dL (ref 8.9–10.3)
Chloride: 105 mmol/L (ref 98–111)
Creatinine: 0.8 mg/dL (ref 0.44–1.00)
GFR, Estimated: >60 mL/min (ref >60)
Glucose, Bld: 157 mg/dL — ABNORMAL HIGH (ref 70–99)
Potassium: 3.5 mmol/L (ref 3.5–5.1)
Sodium: 137 mmol/L (ref 135–145)
Total Bilirubin: 0.3 mg/dL (ref 0.3–1.2)
Total Protein: 6.9 g/dL (ref 6.5–8.1)

## 2022-01-15 LAB — CBC WITH DIFFERENTIAL (CANCER CENTER ONLY)
Abs Immature Granulocytes: 0.01 10*3/uL (ref 0.00–0.07)
Basophils Absolute: 0 10*3/uL (ref 0.0–0.1)
Basophils Relative: 1 %
Eosinophils Absolute: 0.3 10*3/uL (ref 0.0–0.5)
Eosinophils Relative: 7 %
HCT: 39.3 % (ref 36.0–46.0)
Hemoglobin: 13 g/dL (ref 12.0–15.0)
Immature Granulocytes: 0 %
Lymphocytes Relative: 26 %
Lymphs Abs: 1.2 10*3/uL (ref 0.7–4.0)
MCH: 27.5 pg (ref 26.0–34.0)
MCHC: 33.1 g/dL (ref 30.0–36.0)
MCV: 83.1 fL (ref 80.0–100.0)
Monocytes Absolute: 0.4 10*3/uL (ref 0.1–1.0)
Monocytes Relative: 8 %
Neutro Abs: 2.7 10*3/uL (ref 1.7–7.7)
Neutrophils Relative %: 58 %
Platelet Count: 299 10*3/uL (ref 150–400)
RBC: 4.73 MIL/uL (ref 3.87–5.11)
RDW: 12.7 % (ref 11.5–15.5)
WBC Count: 4.5 10*3/uL (ref 4.0–10.5)
nRBC: 0 % (ref 0.0–0.2)

## 2022-01-15 LAB — FERRITIN: Ferritin: 18 ng/mL (ref 11–307)

## 2022-01-15 MED ORDER — RIVAROXABAN 20 MG PO TABS: 20.0000 mg | ORAL_TABLET | Freq: Every day | ORAL | 6 refills | Status: DC

## 2022-01-15 MED ORDER — FOLIC ACID 1 MG PO TABS: 1.0000 mg | ORAL_TABLET | Freq: Every day | ORAL | 6 refills | Status: AC

## 2022-01-15 NOTE — Progress Notes (Signed)
?Hematology and Oncology Follow Up Visit ? ?Shamra Sardina ?[id number] ?1975-10-30 46 y.o. ?01/15/2022 ? ? 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 ?Current Therapy:   ?Eliquis 5 mg PO BID -- long term ?Folic Acid 1 mg po q day ?  ?Interim History:  Ms. Haverfield is here today for follow-up.  This is my first time seeing her.  She is very charming.  She has incredibly interesting job.  She works for [employer].  Because of this, she can travel on other airlines.  I had no clue that [employer] was considered ?. ? ?She has a history of a significant saddle embolus.  This was back in May 2022.  This apparently happened after she got back from a trip to [place]. ? ?She does have a history of a blood clot in the right leg.  This was probably about 10 years ago from what she says.  She did have a Doppler done back in May 2022.  This was negative for any obvious blood clot in the leg. ? ?She has been on Eliquis.  She would like to switch over to Xarelto since Xarelto is once a day. ? ?She has had no problems with chest wall pain.  There is no cough.  There is no shortness of breath.  She has had no nausea or vomiting. ? ?She still has her monthly cycles. ? ?She does travel quite a bit. ? ?She apparently has had a mildly elevated IgM anticardiolipin antibody.  Not sure exactly if this is really significant.  I said that because her beta-2 glycoprotein IgM is normal. ? ?She has had no fever.  She has had no problems with COVID. ? ?She actually just came in today from [place].  She was up there for a graduation.  I think her sister graduated from [employer].   ? ?She has had no issues with headache. ? ?Overall, I would have said that her performance status is probably ECOG 0.   ? ?Medications:  ?Allergies as of 01/15/2022   ?No Known Allergies ?  ? ?  ?Medication List  ?  ? ?  ? Accurate as of Jan 15, 2022  3:34 PM. If you have any questions, ask your nurse or doctor.  ?  ?  ? ?  ? ?apixaban 5 MG Tabs tablet ?Commonly known as: ELIQUIS ?Take 1 tablet (5 mg total) by mouth 2 (two) times daily. ?  ?BD Pen Needle Nano 2nd Gen 32G X 4 MM Misc ?Generic drug: Insulin Pen Needle ?USE DAILY WITH SAXENDA INJECTION AS DIRECTED ?  ?folic acid 1 MG tablet ?Commonly known as: FOLVITE ?Take 1 tablet (1 mg total) by mouth daily. ?  ?magnesium 30 MG tablet ?Take 30 mg by mouth 2 (two) times daily. ?  ?multivitamin tablet ?Take 1 tablet by mouth daily. ?  ?POTASSIUM CHLORIDE PO ?Take 15 mLs by mouth daily as needed. Potassium salt. ?  ?Saxenda 18 MG/3ML Sopn ?Generic drug: Liraglutide -Weight Management ?0.6mg  daily for 7 days, 1.2mg  daily for 7 days, 1.8mg  daily for 7 days, 2.4mg  daily for 7 days, then 3mg  daily maintenance dose ?  ?Vitamin D 125 MCG (5000 UT) Caps ?Take 5,000 Units by mouth daily. ?  ? ?  ? ? ?Allergies: No Known Allergies ? ?Past Medical History, Surgical history, Social history, and Family History were reviewed and updated. ? ?Review of  Systems: ?Review of Systems  ?Constitutional: Negative.   ?HENT: Negative.    ?Eyes: Negative.   ?Respiratory: Negative.    ?Cardiovascular: Negative.   ?Gastrointestinal: Negative.   ?Genitourinary: Negative.   ?Musculoskeletal: Negative.   ?Skin: Negative.   ?Neurological: Negative.   ?Endo/Heme/Allergies: Negative.   ?Psychiatric/Behavioral: Negative.    ? ? ?Physical Exam: ? weight is 205 lb 12.8 oz (93.4 kg). Her temperature is 98.6 ?F (37 ?C). Her blood pressure is 129/85 and her pulse is 95. Her respiration is 20.  ? ?Wt Readings from Last 3 Encounters:  ?01/15/22 205 lb 12.8 oz (93.4 kg)  ?08/11/21 205 lb (93 kg)  ?07/17/21 208 lb (94.3 kg)  ? ? ?Physical Exam ?Vitals reviewed.  ?HENT:  ?   Head: Normocephalic and atraumatic.  ?Eyes:  ?   Pupils: Pupils are equal, round, and reactive to light.  ?Cardiovascular:  ?   Rate and Rhythm: Normal rate and regular rhythm.   ?   Heart sounds: Normal heart sounds.  ?Pulmonary:  ?   Effort: Pulmonary effort is normal.  ?   Breath sounds: Normal breath sounds.  ?Abdominal:  ?   General: Bowel sounds are normal.  ?   Palpations: Abdomen is soft.  ?Musculoskeletal:     ?   General: No tenderness or deformity. Normal range of motion.  ?   Cervical back: Normal range of motion.  ?Lymphadenopathy:  ?   Cervical: No cervical adenopathy.  ?Skin: ?   General: Skin is warm and dry.  ?   Findings: No erythema or rash.  ?Neurological:  ?   Mental Status: She is alert and oriented to person, place, and time.  ?Psychiatric:     ?   Behavior: Behavior normal.     ?   Thought Content: Thought content normal.     ?   Judgment: Judgment normal.  ? ? ? ?Lab Results  ?Component Value Date  ? WBC 4.5 01/15/2022  ? HGB 13.0 01/15/2022  ? HCT 39.3 01/15/2022  ? MCV 83.1 01/15/2022  ? PLT 299 01/15/2022  ? ?Lab Results  ?Component Value Date  ? FERRITIN 94 07/17/2021  ? IRON 96 07/17/2021  ? TIBC 274 07/17/2021  ? UIBC 178 07/17/2021  ? IRONPCTSAT 35 07/17/2021  ? ?Lab Results  ?Component Value Date  ? RETICCTPCT 2.2 03/13/2021  ? RBC 4.73 01/15/2022  ? ?No results found for: KPAFRELGTCHN, LAMBDASER, KAPLAMBRATIO ?No results found for: IGGSERUM, IGA, IGMSERUM ?No results found for: TOTALPROTELP, ALBUMINELP, A1GS, A2GS, BETS, BETA2SER, GAMS, MSPIKE, SPEI ?  Chemistry   ?   ?Component Value Date/Time  ? NA 137 01/15/2022 1452  ? K 3.5 01/15/2022 1452  ? CL 105 01/15/2022 1452  ? CO2 24 01/15/2022 1452  ? BUN 15 01/15/2022 1452  ? CREATININE 0.80 01/15/2022 1452  ?    ?Component Value Date/Time  ? CALCIUM 9.5 01/15/2022 1452  ? ALKPHOS 50 01/15/2022 1452  ? AST 13 (L) 01/15/2022 1452  ? ALT 10 01/15/2022 1452  ? BILITOT 0.3 01/15/2022 1452  ?  ? ? ? ?Impression and Plan: Ms. Treece is a very pleasant 46 yo African American female with recent diagnosis of bilateral pulmonary emboli with right heart strain. She also has history of right lower extremity DVT 4 years  ago.  ? ?Again, I really had to believe that she is going need to be on lifelong anticoagulation.  She has had a significant thromboembolic disease.  This was her second episode.  The first episode she was on Xarelto for a year. ? ?Again with her traveling, I just worry about her being at risk for thromboembolism.  I does think it be easiest and safest for her to be on blood thinner. ? ?I will keep her on full dose for right now. ? ?I think it be worthwhile having to repeat her scans.  I do not see any evidence that she has had scans done since the initial CT scan done a year ago. ? ?I do not think we need to do any Dopplers of her legs since the Dopplers a year ago were normal. ? ?She has sickle cell trait.  I do not think this is related to the blood clots.  I do think however that she does need to be on folic acid.  I will put her on 1 mg folic acid daily. ? ?She is incredibly interesting.  She has a wonderful job.  Her son is going to go to [employer].  I think he wants to be in Radio broadcast assistant. ? ?I would like to see her back probably in September.  Again I know she is going to head down to the [place] for a wedding in October. ? ?Volanda Napoleon, MD ?5/15/20233:34 PM ? ?

## 2022-01-16 LAB — IRON AND IRON BINDING CAPACITY (CC-WL,HP ONLY)
Iron: 110 ug/dL (ref 28–170)
Saturation Ratios: 24 % (ref 10.4–31.8)
TIBC: 451 ug/dL — ABNORMAL HIGH (ref 250–450)
UIBC: 341 ug/dL (ref 148–442)

## 2022-01-18 LAB — CARDIOLIPIN ANTIBODIES, IGG, IGM, IGA
Anticardiolipin IgA: <9 APL U/mL (ref 0–11)
Anticardiolipin IgG: <9 GPL U/mL (ref 0–14)
Anticardiolipin IgM: <9 MPL U/mL (ref 0–12)

## 2022-01-31 ENCOUNTER — Encounter (HOSPITAL_BASED_OUTPATIENT_CLINIC_OR_DEPARTMENT_OTHER): Payer: Self-pay

## 2022-01-31 ENCOUNTER — Ambulatory Visit (HOSPITAL_BASED_OUTPATIENT_CLINIC_OR_DEPARTMENT_OTHER)
Admission: RE | Admit: 2022-01-31 | Discharge: 2022-01-31 | Disposition: A | Discharge status: Home / Self Care | Payer: 59 | Source: Ambulatory Visit | Attending: Hematology & Oncology | Admitting: Hematology & Oncology

## 2022-01-31 DIAGNOSIS — R079 Chest pain, unspecified: Secondary | ICD-10-CM | POA: Insufficient documentation

## 2022-01-31 MED ORDER — IOHEXOL 350 MG/ML SOLN
100.0000 mL | Freq: Once | INTRAVENOUS | Status: AC | PRN
  Administered 2022-01-31: 100 mL via INTRAVENOUS

## 2022-03-21 ENCOUNTER — Other Ambulatory Visit: Payer: Self-pay | Admitting: Family

## 2022-05-22 ENCOUNTER — Other Ambulatory Visit: Payer: Self-pay

## 2022-05-22 DIAGNOSIS — D6859 Other primary thrombophilia: Secondary | ICD-10-CM

## 2022-05-23 ENCOUNTER — Inpatient Hospital Stay: Payer: 59 | Attending: Hematology & Oncology

## 2022-05-23 ENCOUNTER — Inpatient Hospital Stay (HOSPITAL_BASED_OUTPATIENT_CLINIC_OR_DEPARTMENT_OTHER): Payer: 59 | Admitting: Hematology & Oncology

## 2022-05-23 ENCOUNTER — Other Ambulatory Visit: Payer: Self-pay

## 2022-05-23 ENCOUNTER — Encounter: Payer: Self-pay | Admitting: Hematology & Oncology

## 2022-05-23 VITALS — BP 132/87 | HR 80 | Temp 98.6°F | Resp 18 | Ht 63.0 in | Wt 218.8 lb

## 2022-05-23 DIAGNOSIS — I2782 Chronic pulmonary embolism: Secondary | ICD-10-CM | POA: Diagnosis not present

## 2022-05-23 DIAGNOSIS — D573 Sickle-cell trait: Secondary | ICD-10-CM | POA: Insufficient documentation

## 2022-05-23 DIAGNOSIS — Z86711 Personal history of pulmonary embolism: Secondary | ICD-10-CM | POA: Diagnosis not present

## 2022-05-23 DIAGNOSIS — E611 Iron deficiency: Secondary | ICD-10-CM | POA: Diagnosis not present

## 2022-05-23 DIAGNOSIS — Z7901 Long term (current) use of anticoagulants: Secondary | ICD-10-CM | POA: Insufficient documentation

## 2022-05-23 DIAGNOSIS — R768 Other specified abnormal immunological findings in serum: Secondary | ICD-10-CM | POA: Insufficient documentation

## 2022-05-23 DIAGNOSIS — Z86718 Personal history of other venous thrombosis and embolism: Secondary | ICD-10-CM | POA: Insufficient documentation

## 2022-05-23 DIAGNOSIS — D6859 Other primary thrombophilia: Secondary | ICD-10-CM

## 2022-05-23 LAB — CBC WITH DIFFERENTIAL (CANCER CENTER ONLY)
Abs Immature Granulocytes: 0.01 10*3/uL (ref 0.00–0.07)
Basophils Absolute: 0 10*3/uL (ref 0.0–0.1)
Basophils Relative: 1 %
Eosinophils Absolute: 0.1 10*3/uL (ref 0.0–0.5)
Eosinophils Relative: 2 %
HCT: 38.8 % (ref 36.0–46.0)
Hemoglobin: 12.5 g/dL (ref 12.0–15.0)
Immature Granulocytes: 0 %
Lymphocytes Relative: 34 %
Lymphs Abs: 1.8 10*3/uL (ref 0.7–4.0)
MCH: 27.4 pg (ref 26.0–34.0)
MCHC: 32.2 g/dL (ref 30.0–36.0)
MCV: 84.9 fL (ref 80.0–100.0)
Monocytes Absolute: 0.4 10*3/uL (ref 0.1–1.0)
Monocytes Relative: 7 %
Neutro Abs: 2.9 10*3/uL (ref 1.7–7.7)
Neutrophils Relative %: 56 %
Platelet Count: 260 10*3/uL (ref 150–400)
RBC: 4.57 MIL/uL (ref 3.87–5.11)
RDW: 12.5 % (ref 11.5–15.5)
WBC Count: 5.1 10*3/uL (ref 4.0–10.5)
nRBC: 0 % (ref 0.0–0.2)

## 2022-05-23 LAB — CMP (CANCER CENTER ONLY)
ALT: 10 U/L (ref 0–44)
AST: 12 U/L — ABNORMAL LOW (ref 15–41)
Albumin: 4.2 g/dL (ref 3.5–5.0)
Alkaline Phosphatase: 54 U/L (ref 38–126)
Anion gap: 10 (ref 5–15)
BUN: 13 mg/dL (ref 6–20)
CO2: 21 mmol/L — ABNORMAL LOW (ref 22–32)
Calcium: 9.5 mg/dL (ref 8.9–10.3)
Chloride: 106 mmol/L (ref 98–111)
Creatinine: 0.85 mg/dL (ref 0.44–1.00)
GFR, Estimated: >60 mL/min (ref >60)
Glucose, Bld: 87 mg/dL (ref 70–99)
Potassium: 3.6 mmol/L (ref 3.5–5.1)
Sodium: 137 mmol/L (ref 135–145)
Total Bilirubin: 0.4 mg/dL (ref 0.3–1.2)
Total Protein: 7.4 g/dL (ref 6.5–8.1)

## 2022-05-23 NOTE — Progress Notes (Signed)
Hematology and Oncology Follow Up Visit  Annette Lewis [id number] 13-Nov-1975 46 y.o. 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  She is looking great.  She feels great.  She is still quite busy working for [employer].  When we last saw her in May, we did do a CT angiogram of her chest.  There is no residual pulmonary emboli.  She is on Eliquis.  She will stay on Eliquis indefinitely.  She has had no problems with cough or chest wall pain.  There is been no fever.  She has had no nausea or vomiting.  She has had no change in bowel or bladder habits.  She has had no leg swelling.  Next year, likely in October, she will go to [place].  She was wondering if it safe for her to fly that long.  I told her that it was safe.  However, I probably would also have her on baby aspirin.  I told her that to take baby aspirin (81 mg) and start this 2 days before she goes on her trip.  She will take it for 2 days after she lands in [place].  Then, she will repeat the entire process when she comes home.  She has had no headache.  There has been no problems with her blood sugars.  Overall, I would say performance status is probably ECOG 0.     Medications:  Allergies as of 05/23/2022   No Known Allergies      Medication List        Accurate as of May 23, 2022  3:21 PM. If you have any questions, ask your nurse or doctor.          BD Pen Needle Nano 2nd Gen 32G X 4 MM Misc Generic drug: Insulin Pen Needle USE DAILY WITH SAXENDA INJECTION AS DIRECTED   Eliquis 5 MG Tabs tablet Generic drug: apixaban TAKE 1 TABLET(5 MG) BY MOUTH TWICE DAILY   folic acid 1 MG tablet Commonly known as: FOLVITE Take 1  tablet (1 mg total) by mouth daily. What changed: Another medication with the same name was removed. Continue taking this medication, and follow the directions you see here. Changed by: Volanda Napoleon, MD   magnesium 30 MG tablet Take 30 mg by mouth 2 (two) times daily.   multivitamin tablet Take 1 tablet by mouth daily.   POTASSIUM CHLORIDE PO Take 15 mLs by mouth daily as needed. Potassium salt.   rivaroxaban 20 MG Tabs tablet Commonly known as: XARELTO Take 1 tablet (20 mg total) by mouth daily with supper.   Saxenda 18 MG/3ML Sopn Generic drug: Liraglutide -Weight Management 0.6mg  daily for 7 days, 1.2mg  daily for 7 days, 1.8mg  daily for 7 days, 2.4mg  daily for 7 days, then 3mg  daily maintenance dose   Vitamin D 125 MCG (5000 UT) Caps Take 5,000 Units by mouth daily.        Allergies: No Known Allergies  Past Medical History, Surgical history, Social history, and Family History were reviewed and updated.  Review of Systems: Review of Systems  Constitutional: Negative.   HENT: Negative.    Eyes: Negative.   Respiratory: Negative.  Cardiovascular: Negative.   Gastrointestinal: Negative.   Genitourinary: Negative.   Musculoskeletal: Negative.   Skin: Negative.   Neurological: Negative.   Endo/Heme/Allergies: Negative.   Psychiatric/Behavioral: Negative.       Physical Exam:  height is 5\' 3"  (1.6 m) and weight is 218 lb 12.8 oz (99.2 kg). Her oral temperature is 98.6 F (37 C). Her blood pressure is 132/87 and her pulse is 80. Her respiration is 18 and oxygen saturation is 100%.   Wt Readings from Last 3 Encounters:  05/23/22 218 lb 12.8 oz (99.2 kg)  01/15/22 205 lb 12.8 oz (93.4 kg)  08/11/21 205 lb (93 kg)    Physical Exam Vitals reviewed.  HENT:     Head: Normocephalic and atraumatic.  Eyes:     Pupils: Pupils are equal, round, and reactive to light.  Cardiovascular:     Rate and Rhythm: Normal rate and regular rhythm.     Heart sounds: Normal  heart sounds.  Pulmonary:     Effort: Pulmonary effort is normal.     Breath sounds: Normal breath sounds.  Abdominal:     General: Bowel sounds are normal.     Palpations: Abdomen is soft.  Musculoskeletal:        General: No tenderness or deformity. Normal range of motion.     Cervical back: Normal range of motion.  Lymphadenopathy:     Cervical: No cervical adenopathy.  Skin:    General: Skin is warm and dry.     Findings: No erythema or rash.  Neurological:     Mental Status: She is alert and oriented to person, place, and time.  Psychiatric:        Behavior: Behavior normal.        Thought Content: Thought content normal.        Judgment: Judgment normal.      Lab Results  Component Value Date   WBC 5.1 05/23/2022   HGB 12.5 05/23/2022   HCT 38.8 05/23/2022   MCV 84.9 05/23/2022   PLT 260 05/23/2022   Lab Results  Component Value Date   FERRITIN 18 01/15/2022   IRON 110 01/15/2022   TIBC 451 (H) 01/15/2022   UIBC 341 01/15/2022   IRONPCTSAT 24 01/15/2022   Lab Results  Component Value Date   RETICCTPCT 2.2 03/13/2021   RBC 4.57 05/23/2022   No results found for: "KPAFRELGTCHN", "LAMBDASER", "KAPLAMBRATIO" No results found for: "IGGSERUM", "IGA", "IGMSERUM" No results found for: "TOTALPROTELP", "ALBUMINELP", "A1GS", "A2GS", "BETS", "BETA2SER", "GAMS", "MSPIKE", "SPEI"   Chemistry      Component Value Date/Time   NA 137 01/15/2022 1452   K 3.5 01/15/2022 1452   CL 105 01/15/2022 1452   CO2 24 01/15/2022 1452   BUN 15 01/15/2022 1452   CREATININE 0.80 01/15/2022 1452      Component Value Date/Time   CALCIUM 9.5 01/15/2022 1452   ALKPHOS 50 01/15/2022 1452   AST 13 (L) 01/15/2022 1452   ALT 10 01/15/2022 1452   BILITOT 0.3 01/15/2022 1452       Impression and Plan: [name] is a very pleasant 46 yo African American female with recent diagnosis of bilateral pulmonary emboli with right heart strain. She also has history of right lower extremity DVT  4 years ago.   Again, I do not see any problems with her being on long-term Eliquis.  I know she travels quite a bit.  I told her that if she is on any flight that is probably  5 hours or longer, it probably would not be a bad idea to use baby aspirin while she is flying.  I would like to see her back in about 6 months.  I do not see that we have to do any scans or Dopplers on her.  I know that she is going to be very diligent with trying to control her blood sugars.    Again, I really had to believe that she is going need to be on lifelong anticoagulation.  She has had a significant thromboembolic disease.  This was her second episode.  The first episode she was on Xarelto for a year.  Again with her traveling, I just worry about her being at risk for thromboembolism.  I does think it be easiest and safest for her to be on blood thinner.  I will keep her on full dose for right now.  I think it be worthwhile having to repeat her scans.  I do not see any evidence that she has had scans done since the initial CT scan done a year ago.  I do not think we need to do any Dopplers of her legs since the Dopplers a year ago were normal.  She does have sickle cell trait.  She is on folic acid to help with this.   Josph Macho, MD 9/20/20233:21 PM

## 2022-05-24 ENCOUNTER — Telehealth: Payer: Self-pay

## 2022-05-24 LAB — IRON AND IRON BINDING CAPACITY (CC-WL,HP ONLY)
Iron: 41 ug/dL (ref 28–170)
Saturation Ratios: 9 % — ABNORMAL LOW (ref 10.4–31.8)
TIBC: 468 ug/dL — ABNORMAL HIGH (ref 250–450)
UIBC: 427 ug/dL (ref 148–442)

## 2022-05-24 LAB — FERRITIN: Ferritin: 10 ng/mL — ABNORMAL LOW (ref 11–307)

## 2022-05-24 LAB — CARDIOLIPIN ANTIBODIES, IGG, IGM, IGA
Anticardiolipin IgA: <9 APL U/mL (ref 0–11)
Anticardiolipin IgG: <9 GPL U/mL (ref 0–14)
Anticardiolipin IgM: <9 MPL U/mL (ref 0–12)

## 2022-05-24 NOTE — Telephone Encounter (Signed)
Called and informed patient of lab results, patient verbalized understanding and denies any questions or concerns at this time.   

## 2022-05-24 NOTE — Telephone Encounter (Signed)
-----   Message from Volanda Napoleon, MD sent at 05/24/2022  6:23 AM EDT ----- Call- the iron is a little low.  Please try taking OTC iron supplements.  Make sure that you take Vit C daily.  Laurey Arrow

## 2022-11-21 ENCOUNTER — Inpatient Hospital Stay: Payer: 59 | Attending: Hematology & Oncology

## 2022-11-21 ENCOUNTER — Inpatient Hospital Stay: Payer: 59 | Admitting: Hematology & Oncology

## 2022-11-22 ENCOUNTER — Encounter: Payer: Self-pay | Admitting: Hematology & Oncology

## 2022-12-08 IMAGING — CT CT ANGIO CHEST
2 of 8 series · 19 of 36 positions shown · IV contrast (Omnipaque)
Comparison: 01/26/2021

CLINICAL DATA: Prior pulmonary embolus and thrombolysis, current
anti coagulation therapy.

EXAM:
CT ANGIOGRAPHY CHEST WITH CONTRAST
TECHNIQUE: Multidetector CT imaging of the chest was performed using the
standard protocol during bolus administration of intravenous
contrast. Multiplanar CT image reconstructions and MIPs were
obtained to evaluate the vascular anatomy.

[Series 6: pe thins · axial · 0.61mm/px · z∈[-285,-36]mm · 18 of 279 slices shown]
[im 15/279  lung]
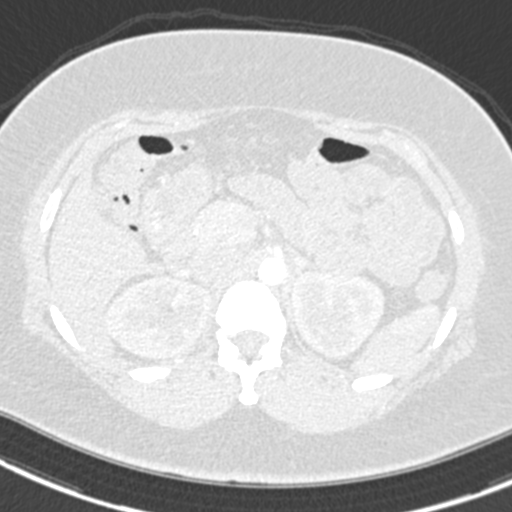
[im 30/279  mediastinal]
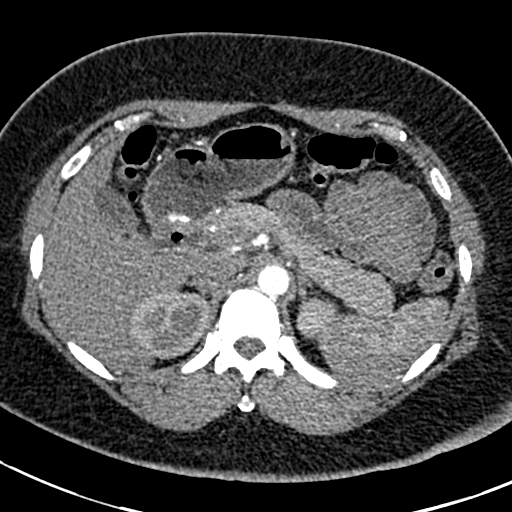
[im 44/279  lung]
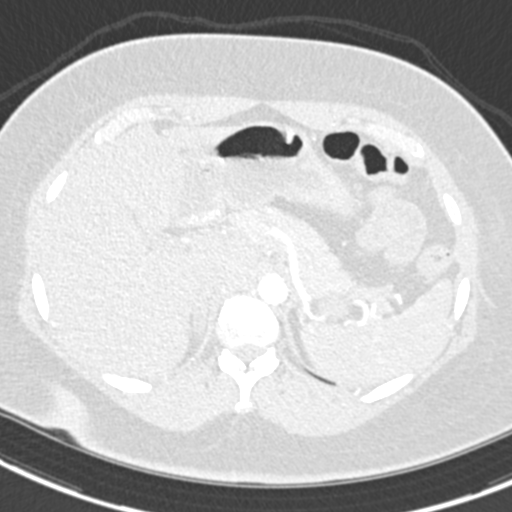
[im 59/279  mediastinal]
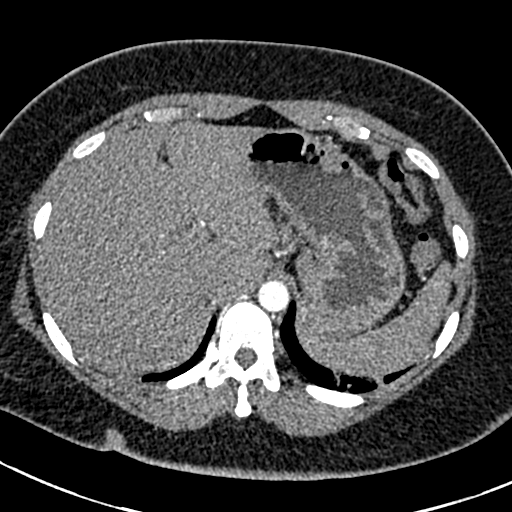
[im 74/279  lung]
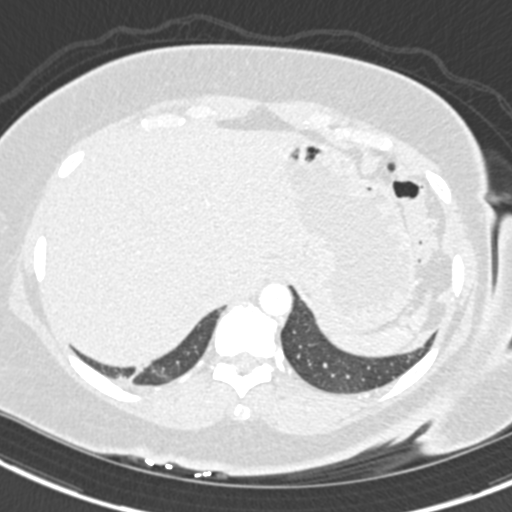
[im 88/279  mediastinal]
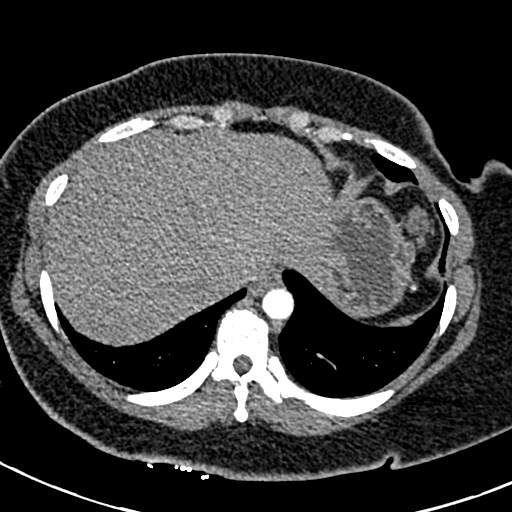
[im 103/279  lung]
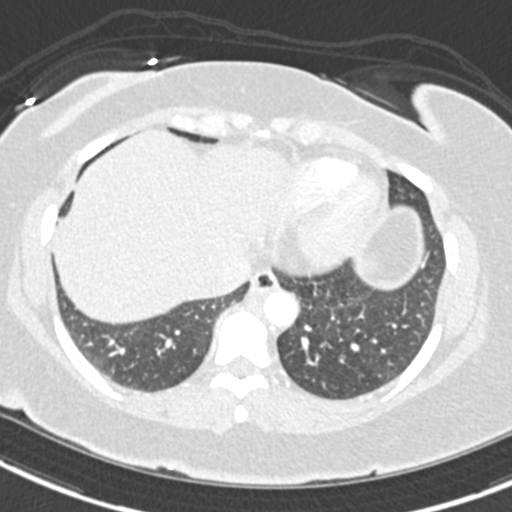
[im 118/279  mediastinal]
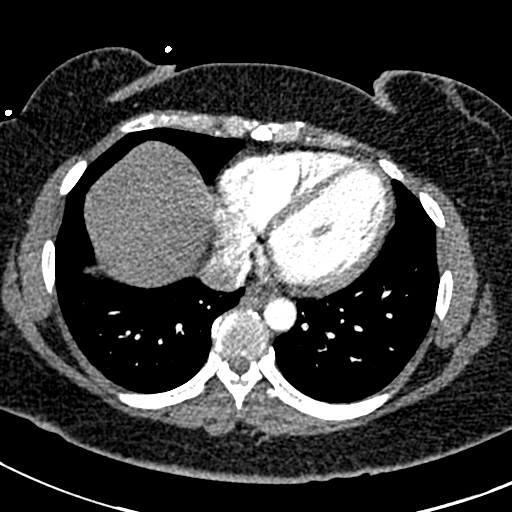
[im 132/279  lung]
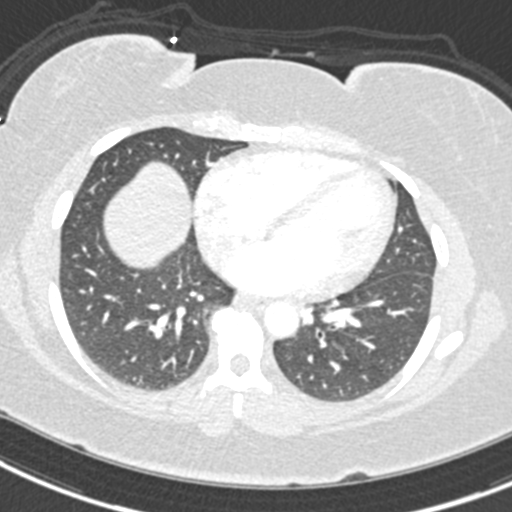
[im 147/279  mediastinal]
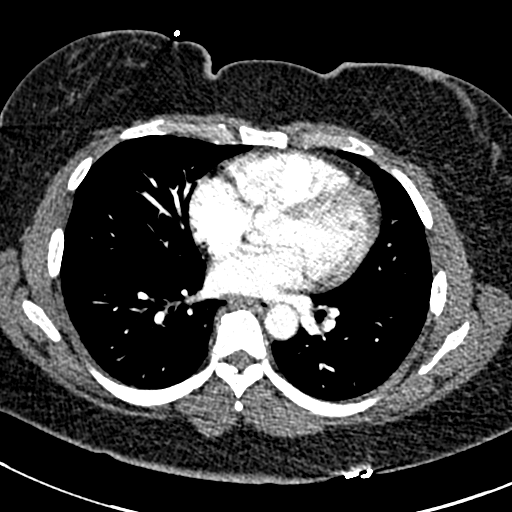
[im 161/279  lung]
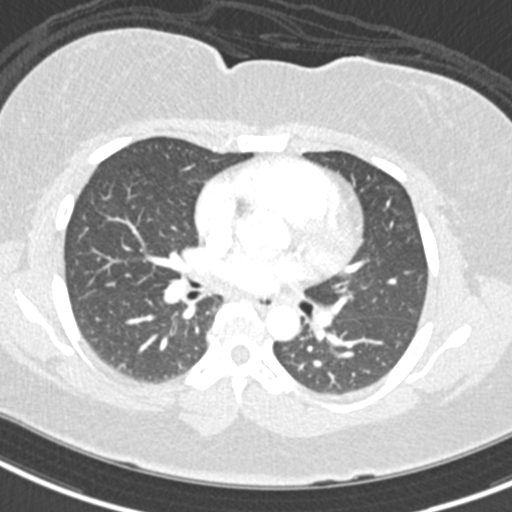
[im 176/279  mediastinal]
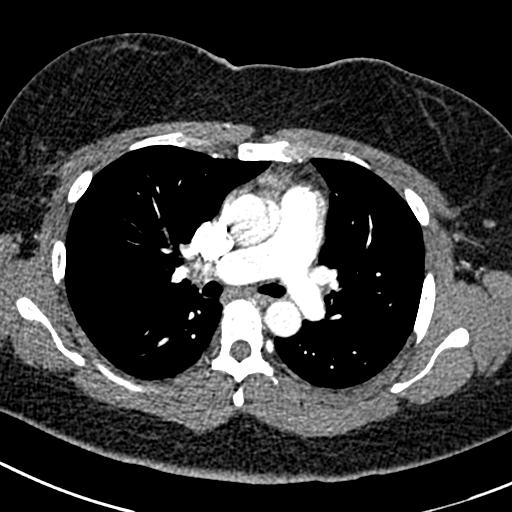
[im 191/279  lung]
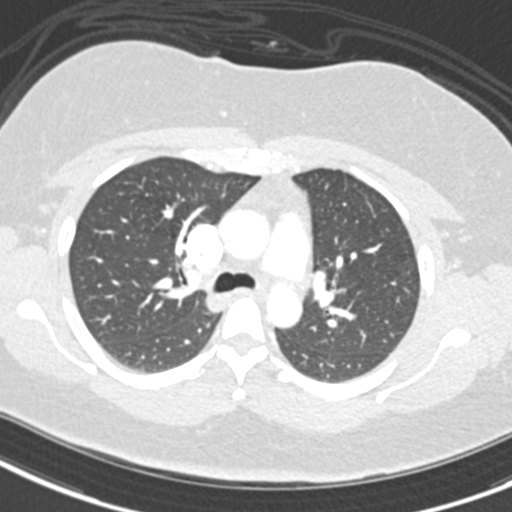
[im 205/279  mediastinal]
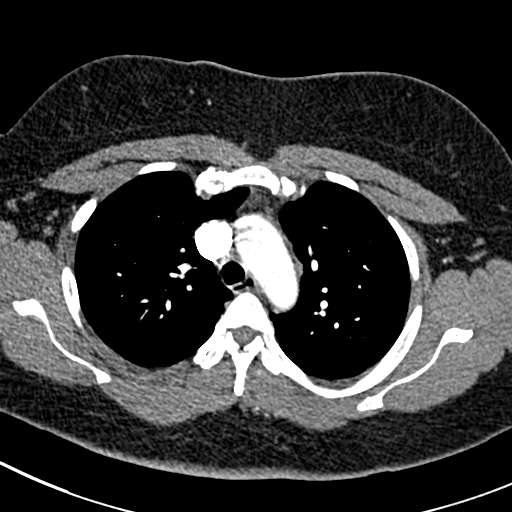
[im 220/279  lung]
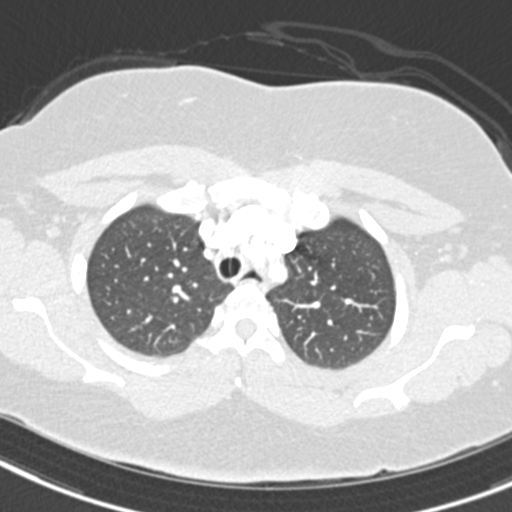
[im 235/279  mediastinal]
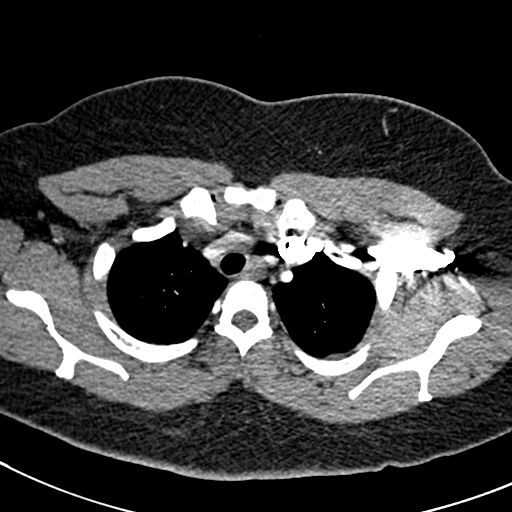
[im 249/279  lung]
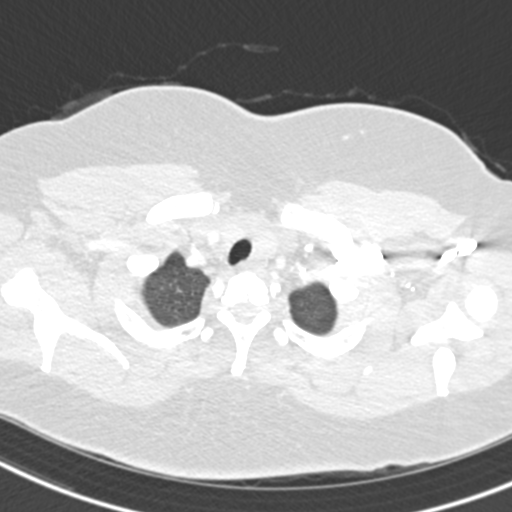
[im 264/279  mediastinal]
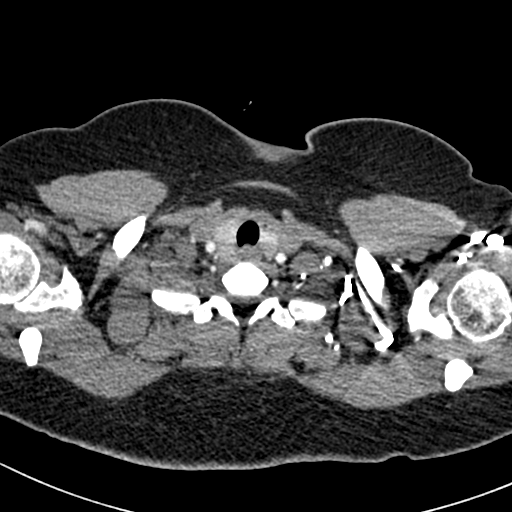

[Series 7: pe coronal mpr · coronal · 0.58mm/px · 1 of 108 slices shown]
[im 54/108  mediastinal]
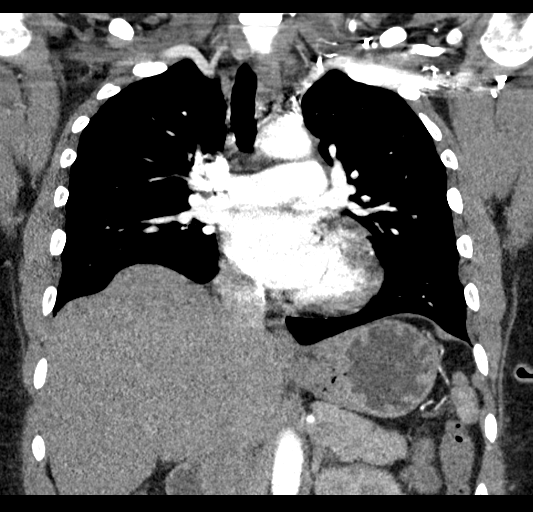

[19 of 36 positions shown; findings below may reference images not displayed]

RADIATION DOSE REDUCTION: This exam was performed according to the
departmental dose-optimization program which includes automated
exposure control, adjustment of the mA and/or kV according to
patient size and/or use of iterative reconstruction technique.

CONTRAST:  100mL OMNIPAQUE IOHEXOL 350 MG/ML SOLN
FINDINGS: Cardiovascular: No filling defect is identified in the pulmonary
arterial tree to suggest pulmonary embolus. No other significant
vascular findings.

Mediastinum/Nodes: Unremarkable

Lungs/Pleura: Stable mild scarring in the posterior basal segments
of both lower lobes.

Upper Abdomen: Unremarkable

Musculoskeletal: Thoracic spondylosis.

Review of the MIP images confirms the above findings.
IMPRESSION: 1. No residual filling defect in the pulmonary arterial tree to
indicate pulmonary embolus.
2. Stable mild chronic scarring of the posterior basal segments of
both lower lobes.
3. Thoracic spondylosis.

## 2022-12-14 ENCOUNTER — Other Ambulatory Visit: Payer: Self-pay | Admitting: *Deleted

## 2022-12-14 MED ORDER — APIXABAN 5 MG PO TABS: ORAL_TABLET | ORAL | 6 refills | Status: DC

## 2022-12-17 ENCOUNTER — Telehealth: Payer: Self-pay | Admitting: *Deleted

## 2022-12-17 NOTE — Telephone Encounter (Signed)
Message received from patient stating that she will be heading overseas and can not remember what dose of Aspirin Dr. Myna Hidalgo told her to take.  Dr. Myna Hidalgo notified.  Call placed back to patient and patient notified per order of Dr. Myna Hidalgo to start taking Aspirin 81 mg PO the day before her trip, every day of her trip and to stop it the day after she comes home from trip.  Teach back done.  Pt is appreciative of call and has no questions at this time.

## 2023-01-02 ENCOUNTER — Other Ambulatory Visit: Payer: Self-pay

## 2023-01-02 ENCOUNTER — Inpatient Hospital Stay: Payer: 59 | Attending: Hematology & Oncology

## 2023-01-02 ENCOUNTER — Encounter: Payer: Self-pay | Admitting: Hematology & Oncology

## 2023-01-02 ENCOUNTER — Inpatient Hospital Stay (HOSPITAL_BASED_OUTPATIENT_CLINIC_OR_DEPARTMENT_OTHER): Payer: 59 | Admitting: Hematology & Oncology

## 2023-01-02 VITALS — BP 131/71 | HR 80 | Temp 98.5°F | Resp 18 | Ht 63.0 in | Wt 216.1 lb

## 2023-01-02 DIAGNOSIS — I2609 Other pulmonary embolism with acute cor pulmonale: Secondary | ICD-10-CM | POA: Diagnosis not present

## 2023-01-02 DIAGNOSIS — Z7901 Long term (current) use of anticoagulants: Secondary | ICD-10-CM | POA: Diagnosis not present

## 2023-01-02 DIAGNOSIS — I2782 Chronic pulmonary embolism: Secondary | ICD-10-CM

## 2023-01-02 DIAGNOSIS — E611 Iron deficiency: Secondary | ICD-10-CM | POA: Insufficient documentation

## 2023-01-02 DIAGNOSIS — Z86718 Personal history of other venous thrombosis and embolism: Secondary | ICD-10-CM | POA: Insufficient documentation

## 2023-01-02 DIAGNOSIS — Z86711 Personal history of pulmonary embolism: Secondary | ICD-10-CM | POA: Diagnosis present

## 2023-01-02 DIAGNOSIS — Z1231 Encounter for screening mammogram for malignant neoplasm of breast: Secondary | ICD-10-CM

## 2023-01-02 DIAGNOSIS — D573 Sickle-cell trait: Secondary | ICD-10-CM | POA: Insufficient documentation

## 2023-01-02 LAB — CBC WITH DIFFERENTIAL (CANCER CENTER ONLY)
Abs Immature Granulocytes: 0.01 10*3/uL (ref 0.00–0.07)
Basophils Absolute: 0 10*3/uL (ref 0.0–0.1)
Basophils Relative: 1 %
Eosinophils Absolute: 0.1 10*3/uL (ref 0.0–0.5)
Eosinophils Relative: 2 %
HCT: 40 % (ref 36.0–46.0)
Hemoglobin: 13.1 g/dL (ref 12.0–15.0)
Immature Granulocytes: 0 %
Lymphocytes Relative: 29 %
Lymphs Abs: 1.5 10*3/uL (ref 0.7–4.0)
MCH: 28 pg (ref 26.0–34.0)
MCHC: 32.8 g/dL (ref 30.0–36.0)
MCV: 85.5 fL (ref 80.0–100.0)
Monocytes Absolute: 0.4 10*3/uL (ref 0.1–1.0)
Monocytes Relative: 8 %
Neutro Abs: 3.1 10*3/uL (ref 1.7–7.7)
Neutrophils Relative %: 60 %
Platelet Count: 284 10*3/uL (ref 150–400)
RBC: 4.68 MIL/uL (ref 3.87–5.11)
RDW: 13.5 % (ref 11.5–15.5)
WBC Count: 5.1 10*3/uL (ref 4.0–10.5)
nRBC: 0 % (ref 0.0–0.2)

## 2023-01-02 LAB — D-DIMER, QUANTITATIVE: D-Dimer, Quant: 0.3 ug/mL-FEU (ref 0.00–0.50)

## 2023-01-02 LAB — CMP (CANCER CENTER ONLY)
ALT: 15 U/L (ref 0–44)
AST: 16 U/L (ref 15–41)
Albumin: 4.2 g/dL (ref 3.5–5.0)
Alkaline Phosphatase: 49 U/L (ref 38–126)
Anion gap: 7 (ref 5–15)
BUN: 13 mg/dL (ref 6–20)
CO2: 25 mmol/L (ref 22–32)
Calcium: 9.6 mg/dL (ref 8.9–10.3)
Chloride: 104 mmol/L (ref 98–111)
Creatinine: 0.86 mg/dL (ref 0.44–1.00)
GFR, Estimated: >60 mL/min (ref >60)
Glucose, Bld: 89 mg/dL (ref 70–99)
Potassium: 4.3 mmol/L (ref 3.5–5.1)
Sodium: 136 mmol/L (ref 135–145)
Total Bilirubin: 0.4 mg/dL (ref 0.3–1.2)
Total Protein: 7.7 g/dL (ref 6.5–8.1)

## 2023-01-02 NOTE — Progress Notes (Signed)
Hematology and Oncology Follow Up Visit  Kyleena Scheirer [id number] [birth year] 47 y.o. 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 Holik is here today for follow-up.  We last saw her back in September last year.  She is doing quite well.  She just got back from a nice trip to [place].  She had a wonderful time out there.  She had no problems flying out there.  I think she is supposed to be going to [place] later on this year.  She is not sure if she will be going.  Again I do not see a problem with her flying to [place].  She is having little bit of discomfort in the left lower quadrant of her abdomen.  I am unsure exactly what this might be.  She has had no change in bowel or bladder habits.  She has had no diarrhea.  There is been no bleeding.  She has not had a mammogram in a couple years.  She really needs to have a mammogram done.  I will order this for her.  She continues on the folic acid for sickle cell trait.  She has had no fever.  Thankfully, there is been no problems with COVID.  Overall, there is been no problems with leg swelling.  She has had no leg pain.  She is quite active over in [place] with walking.  Currently, I would say that her performance status is probably ECOG 1.  Medications:  Allergies as of 01/02/2023   No Known Allergies      Medication List        Accurate as of Jan 02, 2023 12:42 PM. If you have any questions, ask your nurse or doctor.          STOP taking these medications    BD Pen Needle Nano 2nd Gen 32G X 4 MM Misc Generic drug: Insulin Pen Needle Stopped by: Josph Macho, MD   rivaroxaban 20 MG Tabs tablet Commonly known as: XARELTO Stopped by: Josph Macho, MD   Saxenda  18 MG/3ML Sopn Generic drug: Liraglutide -Weight Management Stopped by: Josph Macho, MD       TAKE these medications    apixaban 5 MG Tabs tablet Commonly known as: Eliquis TAKE 1 TABLET(5 MG) BY MOUTH TWICE DAILY   folic acid 1 MG tablet Commonly known as: FOLVITE Take 1 tablet (1 mg total) by mouth daily.   magnesium 30 MG tablet Take 30 mg by mouth 2 (two) times daily.   multivitamin tablet Take 1 tablet by mouth daily.   POTASSIUM CHLORIDE PO Take 15 mLs by mouth daily as needed. Potassium salt.   Vitamin D 125 MCG (5000 UT) Caps Take 5,000 Units by mouth daily.        Allergies: No Known Allergies  Past Medical History, Surgical history, Social history, and Family History were reviewed and updated.  Review of Systems: Review of Systems  Constitutional: Negative.   HENT: Negative.    Eyes: Negative.   Respiratory: Negative.    Cardiovascular: Negative.   Gastrointestinal: Negative.   Genitourinary: Negative.   Musculoskeletal: Negative.   Skin: Negative.   Neurological: Negative.   Endo/Heme/Allergies: Negative.   Psychiatric/Behavioral: Negative.  Physical Exam:  height is 5\' 3"  (1.6 m) and weight is 216 lb 1.9 oz (98 kg). Her oral temperature is 98.5 F (36.9 C). Her blood pressure is 131/71 and her pulse is 80. Her respiration is 18 and oxygen saturation is 98%.   Wt Readings from Last 3 Encounters:  01/02/23 216 lb 1.9 oz (98 kg)  05/23/22 218 lb 12.8 oz (99.2 kg)  01/15/22 205 lb 12.8 oz (93.4 kg)    Physical Exam Vitals reviewed.  HENT:     Head: Normocephalic and atraumatic.  Eyes:     Pupils: Pupils are equal, round, and reactive to light.  Cardiovascular:     Rate and Rhythm: Normal rate and regular rhythm.     Heart sounds: Normal heart sounds.  Pulmonary:     Effort: Pulmonary effort is normal.     Breath sounds: Normal breath sounds.  Abdominal:     General: Bowel sounds are normal.     Palpations: Abdomen is soft.   Musculoskeletal:        General: No tenderness or deformity. Normal range of motion.     Cervical back: Normal range of motion.  Lymphadenopathy:     Cervical: No cervical adenopathy.  Skin:    General: Skin is warm and dry.     Findings: No erythema or rash.  Neurological:     Mental Status: She is alert and oriented to person, place, and time.  Psychiatric:        Behavior: Behavior normal.        Thought Content: Thought content normal.        Judgment: Judgment normal.      Lab Results  Component Value Date   WBC 5.1 01/02/2023   HGB 13.1 01/02/2023   HCT 40.0 01/02/2023   MCV 85.5 01/02/2023   PLT 284 01/02/2023   Lab Results  Component Value Date   FERRITIN 10 (L) 05/23/2022   IRON 41 05/23/2022   TIBC 468 (H) 05/23/2022   UIBC 427 05/23/2022   IRONPCTSAT 9 (L) 05/23/2022   Lab Results  Component Value Date   RETICCTPCT 2.2 03/13/2021   RBC 4.68 01/02/2023   No results found for: "KPAFRELGTCHN", "LAMBDASER", "KAPLAMBRATIO" No results found for: "IGGSERUM", "IGA", "IGMSERUM" No results found for: "TOTALPROTELP", "ALBUMINELP", "A1GS", "A2GS", "BETS", "BETA2SER", "GAMS", "MSPIKE", "SPEI"   Chemistry      Component Value Date/Time   NA 137 05/23/2022 1452   K 3.6 05/23/2022 1452   CL 106 05/23/2022 1452   CO2 21 (L) 05/23/2022 1452   BUN 13 05/23/2022 1452   CREATININE 0.85 05/23/2022 1452      Component Value Date/Time   CALCIUM 9.5 05/23/2022 1452   ALKPHOS 54 05/23/2022 1452   AST 12 (L) 05/23/2022 1452   ALT 10 05/23/2022 1452   BILITOT 0.4 05/23/2022 1452       Impression and Plan: Ms. Rusconi is a very pleasant 47 yo African American female with recent diagnosis of bilateral pulmonary emboli with right heart strain. She also has history of right lower extremity DVT about 5 years ago.   Again, I do not see any problems with her being on long-term Eliquis.  I know she travels quite a bit.  I told her that if she is on any flight that is probably 5  hours or longer, it probably would not be a bad idea to use baby aspirin while she is flying.  I would like to see her back in about  6 months.  I do not see that we have to do any scans or Dopplers on her.  I am not sure with his discomfort is in the left lower quadrant of her abdomen.  I really cannot find anything on exam.  I think that her family doctor probably needs to investigate this.  She probably is due for a colonoscopy since she is 47 years old now.  She will continue the folic acid for sickle cell trait.  She will have a mammogram in a couple weeks.  I think this is necessary.  We will go ahead and plan to get her back in another 6 months.    Josph Macho, MD 5/1/202412:42 PM

## 2023-01-21 ENCOUNTER — Ambulatory Visit (HOSPITAL_BASED_OUTPATIENT_CLINIC_OR_DEPARTMENT_OTHER)
Admission: RE | Admit: 2023-01-21 | Discharge: 2023-01-21 | Disposition: A | Discharge status: Home / Self Care | Payer: 59 | Source: Ambulatory Visit | Attending: Hematology & Oncology | Admitting: Hematology & Oncology

## 2023-01-21 ENCOUNTER — Encounter (HOSPITAL_BASED_OUTPATIENT_CLINIC_OR_DEPARTMENT_OTHER): Payer: Self-pay

## 2023-01-21 DIAGNOSIS — Z1231 Encounter for screening mammogram for malignant neoplasm of breast: Secondary | ICD-10-CM | POA: Diagnosis present

## 2023-02-18 ENCOUNTER — Other Ambulatory Visit (HOSPITAL_COMMUNITY): Payer: Self-pay | Admitting: Family Medicine

## 2023-02-18 ENCOUNTER — Ambulatory Visit (HOSPITAL_COMMUNITY)
Admission: RE | Admit: 2023-02-18 | Discharge: 2023-02-18 | Disposition: A | Discharge status: Home / Self Care | Payer: 59 | Source: Ambulatory Visit | Attending: Surgery | Admitting: Surgery

## 2023-02-18 DIAGNOSIS — Z86718 Personal history of other venous thrombosis and embolism: Secondary | ICD-10-CM | POA: Insufficient documentation

## 2023-02-18 DIAGNOSIS — R1031 Right lower quadrant pain: Secondary | ICD-10-CM | POA: Diagnosis present

## 2023-02-18 DIAGNOSIS — R1032 Left lower quadrant pain: Secondary | ICD-10-CM | POA: Diagnosis present

## 2023-02-22 ENCOUNTER — Telehealth: Payer: Self-pay | Admitting: Emergency Medicine

## 2023-02-22 ENCOUNTER — Ambulatory Visit
Admission: RE | Admit: 2023-02-22 | Discharge: 2023-02-22 | Disposition: A | Discharge status: Home / Self Care | Payer: 59 | Source: Ambulatory Visit | Attending: Family Medicine | Admitting: Family Medicine

## 2023-02-22 VITALS — BP 137/82 | HR 98 | Temp 98.8°F | Resp 18 | Ht 63.0 in | Wt 216.1 lb

## 2023-02-22 DIAGNOSIS — N898 Other specified noninflammatory disorders of vagina: Secondary | ICD-10-CM | POA: Insufficient documentation

## 2023-02-22 DIAGNOSIS — N3001 Acute cystitis with hematuria: Secondary | ICD-10-CM | POA: Diagnosis not present

## 2023-02-22 LAB — POCT URINALYSIS DIP (MANUAL ENTRY)
Bilirubin, UA: NEGATIVE
Glucose, UA: NEGATIVE mg/dL
Nitrite, UA: POSITIVE — AB
Spec Grav, UA: 1.015 (ref 1.010–1.025)
Urobilinogen, UA: 0.2 E.U./dL
pH, UA: 6 (ref 5.0–8.0)

## 2023-02-22 MED ORDER — METRONIDAZOLE 500 MG PO TABS: 500.0000 mg | ORAL_TABLET | Freq: Two times a day (BID) | ORAL | 0 refills | Status: DC

## 2023-02-22 MED ORDER — FLUCONAZOLE 200 MG PO TABS: ORAL_TABLET | ORAL | 0 refills | Status: DC

## 2023-02-22 MED ORDER — SULFAMETHOXAZOLE-TRIMETHOPRIM 800-160 MG PO TABS: 1.0000 | ORAL_TABLET | Freq: Two times a day (BID) | ORAL | 0 refills | Status: AC

## 2023-02-22 NOTE — Discharge Instructions (Addendum)
Advised patient to take medications as directed with food to completion.  Encouraged increase daily water intake to 64 ounces per day while taking these medications.  Advised we will follow-up with urine culture and Aptima swab results once received.  Advised if symptoms worsen and/or unresolved please follow-up with PCP, GYN, or here for further evaluation.

## 2023-02-22 NOTE — ED Provider Notes (Signed)
[facility]    CSN: [id number] Arrival date & time: 02/22/23  1436      History   Chief Complaint Chief Complaint  Patient presents with   Vaginal Discharge    HPI Annette Lewis is a 47 y.o. female.   HPI  Pleasant 47 year old female presents with bacterial and/or yeast infection.  Additionally patient reports pressure with urination.  Patient reports had retained tampon for 2 weeks and removed at her home on Sunday, 02/16/2023.  Patient reports has a appointment with her GYN next Thursday, 02/28/2023.  Patient reports saw her PCP last Friday for ongoing leg/groin pain with history of DVT and ultrasound was negative.  Patiently is currently on apixaban and denies any unusual bleeding.     PMH significant for morbid obesity, DVT, and PE.  Past Medical History:  Diagnosis Date   DVT (deep venous thrombosis) (HCC)    Pulmonary embolism [facility])     Patient Active Problem List   Diagnosis Date Noted   Pulmonary embolism (HCC) 01/27/2021   Pulmonary emboli (HCC) 01/26/2021   Pulmonary embolus (HCC) 01/26/2021    Past Surgical History:  Procedure Laterality Date   CESAREAN SECTION     IR ANGIOGRAM PULMONARY BILATERAL SELECTIVE  01/27/2021   IR INFUSION THROMBOL ARTERIAL INITIAL (MS)  01/27/2021   IR US GUIDE VASC ACCESS RIGHT  01/27/2021   IR US GUIDE VASC ACCESS RIGHT  01/27/2021    OB History   No obstetric history on file.      Home Medications    Prior to Admission medications   Medication Sig Start Date End Date Taking? Authorizing Provider  fluconazole (DIFLUCAN) 200 MG tablet Take 1 tab p.o. now, may repeat 1 tab p.o. in 3 days if symptoms are not resolved. 02/22/23  Yes Trevor Iha, FNP  metroNIDAZOLE (FLAGYL) 500 MG tablet Take 1 tablet (500 mg total) by mouth 2 (two) times daily. 02/22/23  Yes Trevor Iha, FNP  sulfamethoxazole-trimethoprim (BACTRIM DS) 800-160 MG tablet Take 1 tablet by mouth 2 (two) times daily for 7 days. 02/22/23 03/01/23 Yes  Trevor Iha, FNP  apixaban (ELIQUIS) 5 MG TABS tablet TAKE 1 TABLET(5 MG) BY MOUTH TWICE DAILY 12/14/22   Josph Macho, MD  Cholecalciferol (VITAMIN D) 125 MCG (5000 UT) CAPS Take 5,000 Units by mouth daily.    [provider]  folic acid (FOLVITE) 1 MG tablet Take 1 tablet (1 mg total) by mouth daily. 01/15/22   Josph Macho, MD  magnesium 30 MG tablet Take 30 mg by mouth 2 (two) times daily.    [provider]  Multiple Vitamin (MULTIVITAMIN) tablet Take 1 tablet by mouth daily.    [provider]  POTASSIUM CHLORIDE PO Take 15 mLs by mouth daily as needed. Potassium salt.    [provider]    Family History History reviewed. No pertinent family history.  Social History Social History   Tobacco Use   Smoking status: Never   Smokeless tobacco: Never  Vaping Use   Vaping Use: Never used  Substance Use Topics   Alcohol use: Not Currently   Drug use: Never     Allergies   Patient has no known allergies.   Review of Systems Review of Systems  Genitourinary:  Positive for frequency, urgency and vaginal discharge.       Believes she may have a bacterial or yeast infection  All other systems reviewed and are negative.    Physical Exam Triage Vital Signs ED  Triage Vitals  Enc Vitals Group     BP 02/22/23 1447 137/82     Pulse Rate 02/22/23 1447 98     Resp 02/22/23 1447 18     Temp 02/22/23 1447 98.8 F (37.1 C)     Temp Source 02/22/23 1447 Oral     SpO2 02/22/23 1447 98 %     Weight 02/22/23 1450 216 lb 0.8 oz (98 kg)     Height 02/22/23 1450 5\' 3"  (1.6 m)     Head Circumference --      Peak Flow --      Pain Score --      Pain Loc --      Pain Edu? --      Excl. in GC? --    No data found.  Updated Vital Signs BP 137/82 (BP Location: Left Arm)   Pulse 98   Temp 98.8 F (37.1 C) (Oral)   Resp 18   Ht 5\' 3"  (1.6 m)   Wt 216 lb 0.8 oz (98 kg)   LMP 02/07/2023 (Exact Date)   SpO2 98%   BMI 38.27 kg/m     Physical Exam Vitals and nursing note reviewed.  Constitutional:      Appearance: Normal appearance. She is obese.  HENT:     Head: Normocephalic and atraumatic.     Mouth/Throat:     Mouth: Mucous membranes are moist.     Pharynx: Oropharynx is clear.  Eyes:     Extraocular Movements: Extraocular movements intact.     Conjunctiva/sclera: Conjunctivae normal.     Pupils: Pupils are equal, round, and reactive to light.  Cardiovascular:     Rate and Rhythm: Normal rate and regular rhythm.     Pulses: Normal pulses.     Heart sounds: Normal heart sounds.  Pulmonary:     Effort: Pulmonary effort is normal.     Breath sounds: Normal breath sounds. No wheezing, rhonchi or rales.  Musculoskeletal:        General: Normal range of motion.     Cervical back: Normal range of motion and neck supple.  Skin:    General: Skin is warm and dry.  Neurological:     General: No focal deficit present.     Mental Status: She is alert and oriented to person, place, and time. Mental status is at baseline.      UC Treatments / Results  Labs (all labs ordered are listed, but only abnormal results are displayed) Labs Reviewed  POCT URINALYSIS DIP (MANUAL ENTRY) - Abnormal; Notable for the following components:      Result Value   Clarity, UA turbid (*)    Ketones, POC UA moderate (40) (*)    Blood, UA moderate (*)    Protein Ur, POC trace (*)    Nitrite, UA Positive (*)    Leukocytes, UA Trace (*)    All other components within normal limits  URINE CULTURE  CERVICOVAGINAL ANCILLARY ONLY    EKG   Radiology No results found.  Procedures Procedures (including critical care time)  Medications Ordered in UC Medications - No data to display  Initial Impression / Assessment and Plan / UC Course  I have reviewed the triage vital signs and the nursing notes.  Pertinent labs & imaging results that were available during my care of the patient were reviewed by me and considered in my  medical decision making (see chart for details).     MDM: 1.  Acute  cystitis with hematuria-Rx'd Bactrim DS 800/160 mg tablet twice daily x 7 days; 2.  Vaginal discharge-Rx'd Flagyl 500 mg capsule twice daily x 7 days; 3.  Vaginal itching-Rx'd Diflucan 200 mg take 1 tablet now may repeat in 3 days if symptoms are not resolved. Advised patient to take medications as directed with food to completion.  Encouraged increase daily water intake to 64 ounces per day while taking these medications.  Advised we will follow-up with urine culture and Aptima swab results once received.  Advised if symptoms worsen and/or unresolved please follow-up with PCP, GYN, or here for further evaluation.  Patient discharged home, hemodynamically stable.  Final Clinical Impressions(s) / UC Diagnoses   Final diagnoses:  Vaginal discharge  Acute cystitis with hematuria  Vaginal itching     Discharge Instructions      Advised patient to take medications as directed with food to completion.  Encouraged increase daily water intake to 64 ounces per day while taking these medications.  Advised we will follow-up with urine culture and Aptima swab results once received.  Advised if symptoms worsen and/or unresolved please follow-up with PCP, GYN, or here for further evaluation.     ED Prescriptions     Medication Sig Dispense Auth. Provider   sulfamethoxazole-trimethoprim (BACTRIM DS) 800-160 MG tablet Take 1 tablet by mouth 2 (two) times daily for 7 days. 14 tablet Trevor Iha, FNP   metroNIDAZOLE (FLAGYL) 500 MG tablet Take 1 tablet (500 mg total) by mouth 2 (two) times daily. 14 tablet Trevor Iha, FNP   fluconazole (DIFLUCAN) 200 MG tablet Take 1 tab p.o. now, may repeat 1 tab p.o. in 3 days if symptoms are not resolved. 7 tablet Trevor Iha, FNP      PDMP not reviewed this encounter.   Trevor Iha, FNP 02/22/23 1536

## 2023-02-22 NOTE — Telephone Encounter (Signed)
Return call to [facility] regarding medication interaction. Pt was told by pharmacy that diflucan may interact w/ eliquis and cause an increase in bleeding. Pt's concerns reviewed w/ provider, M.Ragan, FNP who confirmed pt was okay to take Diflucan. Pt to rest over weekend.

## 2023-02-22 NOTE — ED Triage Notes (Addendum)
Per pt, "I think I have a bacterial or yeast  infection. Have pain in side and leg and do not want to wait until next Thursday to be seen." Pt also has pressure with urination Pt had a retained tampon x 2 weeks  - removed at home on  Sunday 02/17/23 Vaginal discharge started then  Pt has an appt w/ GYN next Thursday  Pt saw her PCP last Friday for ongoing leg/groin pain - hx of DVT - US was negative

## 2023-02-23 LAB — URINE CULTURE

## 2023-02-25 LAB — CERVICOVAGINAL ANCILLARY ONLY
Bacterial Vaginitis (gardnerella): NEGATIVE
Candida Glabrata: NEGATIVE
Candida Vaginitis: NEGATIVE
Chlamydia: NEGATIVE
Comment: NEGATIVE
Comment: NEGATIVE
Comment: NEGATIVE
Comment: NEGATIVE
Comment: NEGATIVE
Comment: NORMAL
Neisseria Gonorrhea: NEGATIVE
Trichomonas: NEGATIVE

## 2023-02-25 LAB — URINE CULTURE

## 2023-04-20 ENCOUNTER — Other Ambulatory Visit: Payer: Self-pay

## 2023-04-20 ENCOUNTER — Emergency Department (HOSPITAL_BASED_OUTPATIENT_CLINIC_OR_DEPARTMENT_OTHER): Payer: 59

## 2023-04-20 ENCOUNTER — Emergency Department (HOSPITAL_BASED_OUTPATIENT_CLINIC_OR_DEPARTMENT_OTHER)
Admission: EM | Admit: 2023-04-20 | Discharge: 2023-04-20 | Disposition: A | Discharge status: Home / Self Care | Payer: 59 | Attending: Emergency Medicine | Admitting: Emergency Medicine

## 2023-04-20 ENCOUNTER — Encounter (HOSPITAL_BASED_OUTPATIENT_CLINIC_OR_DEPARTMENT_OTHER): Payer: Self-pay | Admitting: Emergency Medicine

## 2023-04-20 DIAGNOSIS — R8271 Bacteriuria: Secondary | ICD-10-CM | POA: Diagnosis not present

## 2023-04-20 DIAGNOSIS — Z7901 Long term (current) use of anticoagulants: Secondary | ICD-10-CM | POA: Diagnosis not present

## 2023-04-20 DIAGNOSIS — R1030 Lower abdominal pain, unspecified: Secondary | ICD-10-CM | POA: Diagnosis present

## 2023-04-20 DIAGNOSIS — D259 Leiomyoma of uterus, unspecified: Secondary | ICD-10-CM

## 2023-04-20 LAB — URINALYSIS, ROUTINE W REFLEX MICROSCOPIC
Bilirubin Urine: NEGATIVE
Glucose, UA: NEGATIVE mg/dL
Ketones, ur: 40 mg/dL — AB
Nitrite: NEGATIVE
Protein, ur: NEGATIVE mg/dL
Specific Gravity, Urine: 1.02 (ref 1.005–1.030)
pH: 5.5 (ref 5.0–8.0)

## 2023-04-20 LAB — COMPREHENSIVE METABOLIC PANEL
ALT: 18 U/L (ref 0–44)
AST: 19 U/L (ref 15–41)
Albumin: 3.9 g/dL (ref 3.5–5.0)
Alkaline Phosphatase: 56 U/L (ref 38–126)
Anion gap: 12 (ref 5–15)
BUN: 20 mg/dL (ref 6–20)
CO2: 18 mmol/L — ABNORMAL LOW (ref 22–32)
Calcium: 8.7 mg/dL — ABNORMAL LOW (ref 8.9–10.3)
Chloride: 107 mmol/L (ref 98–111)
Creatinine, Ser: 0.81 mg/dL (ref 0.44–1.00)
GFR, Estimated: >60 mL/min (ref >60)
Glucose, Bld: 88 mg/dL (ref 70–99)
Potassium: 3.7 mmol/L (ref 3.5–5.1)
Sodium: 137 mmol/L (ref 135–145)
Total Bilirubin: 0.5 mg/dL (ref 0.3–1.2)
Total Protein: 7.1 g/dL (ref 6.5–8.1)

## 2023-04-20 LAB — CBC WITH DIFFERENTIAL/PLATELET
Abs Immature Granulocytes: 0.01 10*3/uL (ref 0.00–0.07)
Basophils Absolute: 0 10*3/uL (ref 0.0–0.1)
Basophils Relative: 1 %
Eosinophils Absolute: 0.1 10*3/uL (ref 0.0–0.5)
Eosinophils Relative: 3 %
HCT: 39.2 % (ref 36.0–46.0)
Hemoglobin: 12.9 g/dL (ref 12.0–15.0)
Immature Granulocytes: 0 %
Lymphocytes Relative: 24 %
Lymphs Abs: 1.3 10*3/uL (ref 0.7–4.0)
MCH: 28.1 pg (ref 26.0–34.0)
MCHC: 32.9 g/dL (ref 30.0–36.0)
MCV: 85.4 fL (ref 80.0–100.0)
Monocytes Absolute: 0.3 10*3/uL (ref 0.1–1.0)
Monocytes Relative: 7 %
Neutro Abs: 3.5 10*3/uL (ref 1.7–7.7)
Neutrophils Relative %: 65 %
Platelets: 275 10*3/uL (ref 150–400)
RBC: 4.59 MIL/uL (ref 3.87–5.11)
RDW: 13.2 % (ref 11.5–15.5)
WBC: 5.2 10*3/uL (ref 4.0–10.5)
nRBC: 0 % (ref 0.0–0.2)

## 2023-04-20 LAB — URINALYSIS, MICROSCOPIC (REFLEX)

## 2023-04-20 LAB — PREGNANCY, URINE: Preg Test, Ur: NEGATIVE

## 2023-04-20 LAB — LIPASE, BLOOD: Lipase: 38 U/L (ref 11–51)

## 2023-04-20 MED ORDER — CEPHALEXIN 500 MG PO CAPS: 500.0000 mg | ORAL_CAPSULE | Freq: Two times a day (BID) | ORAL | 0 refills | Status: AC

## 2023-04-20 MED ORDER — IOHEXOL 300 MG/ML  SOLN
100.0000 mL | Freq: Once | INTRAMUSCULAR | Status: AC | PRN
  Administered 2023-04-20: 100 mL via INTRAVENOUS

## 2023-04-20 NOTE — ED Triage Notes (Addendum)
All over abd pain x 2 days , seen at [facility] yesterday no N/V/D , sharp pain constant, hurts worse to lay down left groin pain x 2 months also  last BM normal was told yesterday she  blood in her urine

## 2023-04-20 NOTE — Discharge Instructions (Addendum)
You were seen for your lower abdominal pain in the emergency department.  You had a CT scan that showed fibroids in her uterus.  At home, please take Tylenol and ibuprofen for your pain.  If you start developing symptoms of urinary tract infection take the Keflex we have prescribed you.    Check your MyChart online for the results of any tests that had not resulted by the time you left the emergency department.   Follow-up with your primary doctor in 2-3 days regarding your visit.  Follow-up with your OB/GYN as soon as possible.  Return immediately to the emergency department if you experience any of the following: Worsening pain, fever, vomiting, or any other concerning symptoms.    Thank you for visiting our Emergency Department. It was a pleasure taking care of you today.

## 2023-04-20 NOTE — ED Provider Notes (Signed)
[facility] Provider Note   CSN: [id number] Arrival date & time: 04/20/23  4098     History  Chief Complaint  Patient presents with   Abdominal Pain    Annette Lewis is a 47 y.o. female.  47 year old female with a history of PE on Eliquis who presents to the emergency department with lower abdominal pain for the past 2 days.  Reports that over the past 2 days has had lower abdominal pain that has been constant.  Is a pulling sensation.  7 out of 10 in severity.  Worsened with laying down.  No fevers, nausea, vomiting, diarrhea.  Not on any diabetic medications.  No alcohol use.  No known injuries.  Last bowel movement was yesterday and is still passing gas.  No vaginal discharge or bleeding.  Did go to urgent care yesterday and reported an x-ray that did not show any signs of a bowel obstruction.  Does appear to have had a visit on 04/08/2023 as well where she was diagnosed with bilateral ovarian cysts.       Home Medications Prior to Admission medications   Medication Sig Start Date End Date Taking? Authorizing Provider  cephALEXin (KEFLEX) 500 MG capsule Take 1 capsule (500 mg total) by mouth 2 (two) times daily for 7 days. Take if you start developing symptoms of urinary tract infection 04/20/23 04/27/23 Yes Rondel Baton, MD  apixaban (ELIQUIS) 5 MG TABS tablet TAKE 1 TABLET(5 MG) BY MOUTH TWICE DAILY 12/14/22   Josph Macho, MD  Cholecalciferol (VITAMIN D) 125 MCG (5000 UT) CAPS Take 5,000 Units by mouth daily.    [provider]  fluconazole (DIFLUCAN) 200 MG tablet Take 1 tab p.o. now, may repeat 1 tab p.o. in 3 days if symptoms are not resolved. 02/22/23   Trevor Iha, FNP  folic acid (FOLVITE) 1 MG tablet Take 1 tablet (1 mg total) by mouth daily. 01/15/22   Josph Macho, MD  magnesium 30 MG tablet Take 30 mg by mouth 2 (two) times daily.    [provider]  metroNIDAZOLE (FLAGYL) 500 MG tablet Take 1  tablet (500 mg total) by mouth 2 (two) times daily. 02/22/23   Trevor Iha, FNP  Multiple Vitamin (MULTIVITAMIN) tablet Take 1 tablet by mouth daily.    [provider]  POTASSIUM CHLORIDE PO Take 15 mLs by mouth daily as needed. Potassium salt.    [provider]      Allergies    Patient has no known allergies.    Review of Systems   Review of Systems  Physical Exam Updated Vital Signs BP (!) 105/55   Pulse 77   Temp 98.4 F (36.9 C) (Oral)   Resp 16   Ht 5\' 3"  (1.6 m)   Wt 88.5 kg   SpO2 100%   BMI 34.56 kg/m  Physical Exam Vitals and nursing note reviewed.  Constitutional:      General: She is not in acute distress.    Appearance: She is well-developed.  HENT:     Head: Normocephalic and atraumatic.     Right Ear: External ear normal.     Left Ear: External ear normal.     Nose: Nose normal.  Eyes:     Extraocular Movements: Extraocular movements intact.     Conjunctiva/sclera: Conjunctivae normal.     Pupils: Pupils are equal, round, and reactive to light.  Cardiovascular:     Rate and Rhythm: Normal rate  and regular rhythm.  Pulmonary:     Effort: Pulmonary effort is normal. No respiratory distress.  Abdominal:     General: Abdomen is flat. There is no distension.     Palpations: Abdomen is soft. There is no mass.     Tenderness: There is no abdominal tenderness. There is no guarding.  Musculoskeletal:     Cervical back: Normal range of motion and neck supple.     Right lower leg: No edema.     Left lower leg: No edema.  Skin:    General: Skin is warm and dry.  Neurological:     Mental Status: She is alert and oriented to person, place, and time. Mental status is at baseline.  Psychiatric:        Mood and Affect: Mood normal.     ED Results / Procedures / Treatments   Labs (all labs ordered are listed, but only abnormal results are displayed) Labs Reviewed  COMPREHENSIVE METABOLIC PANEL - Abnormal; Notable for the following  components:      Result Value   CO2 18 (*)    Calcium 8.7 (*)    All other components within normal limits  URINALYSIS, ROUTINE W REFLEX MICROSCOPIC - Abnormal; Notable for the following components:   Hgb urine dipstick MODERATE (*)    Ketones, ur 40 (*)    Leukocytes,Ua TRACE (*)    All other components within normal limits  URINALYSIS, MICROSCOPIC (REFLEX) - Abnormal; Notable for the following components:   Bacteria, UA FEW (*)    All other components within normal limits  URINE CULTURE  LIPASE, BLOOD  CBC WITH DIFFERENTIAL/PLATELET  PREGNANCY, URINE    EKG None  Radiology CT ABDOMEN PELVIS W CONTRAST  Result Date: 04/20/2023 CLINICAL DATA:  Lower abdominal and pelvic pain for 2 days . Left groin pain. EXAM: CT ABDOMEN AND PELVIS WITH CONTRAST TECHNIQUE: Multidetector CT imaging of the abdomen and pelvis was performed using the standard protocol following bolus administration of intravenous contrast. RADIATION DOSE REDUCTION: This exam was performed according to the departmental dose-optimization program which includes automated exposure control, adjustment of the mA and/or kV according to patient size and/or use of iterative reconstruction technique. CONTRAST:  OMNIPAQUE IOHEXOL 300 MG/ML  SOLN COMPARISON:  No comparison studies available. FINDINGS: Lower chest: Basilar atelectasis bilaterally. Hepatobiliary: No suspicious focal abnormality within the liver parenchyma. Small area of low attenuation in the anterior liver, adjacent to the falciform ligament, is in a characteristic location for focal fatty deposition. There is no evidence for gallstones, gallbladder wall thickening, or pericholecystic fluid. No intrahepatic or extrahepatic biliary dilation. Pancreas: No focal mass lesion. No dilatation of the main duct. No intraparenchymal cyst. No peripancreatic edema. Spleen: No splenomegaly. No suspicious focal mass lesion. Adrenals/Urinary Tract: No adrenal nodule or mass. Kidneys  unremarkable. No evidence for hydroureter. The urinary bladder appears normal for the degree of distention. Stomach/Bowel: Stomach is unremarkable. No gastric wall thickening. No evidence of outlet obstruction. Duodenum is normally positioned as is the ligament of Treitz. No small bowel wall thickening. No small bowel dilatation. The terminal ileum is normal. The appendix is normal. No gross colonic mass. No colonic wall thickening. Vascular/Lymphatic: No abdominal aortic aneurysm. No abdominal aortic atherosclerotic calcification. There is no gastrohepatic or hepatoduodenal ligament lymphadenopathy. No retroperitoneal or mesenteric lymphadenopathy. No pelvic sidewall lymphadenopathy. Reproductive: Uterine fibroids evident.  There is no adnexal mass. Other: No intraperitoneal free fluid. Musculoskeletal: No worrisome lytic or sclerotic osseous abnormality. IMPRESSION: 1. No acute  findings in the abdomen or pelvis. Specifically, no findings to explain the patient's history of lower abdominal pain. 2. Uterine fibroids. Electronically Signed   By: Kennith Center M.D.   On: 04/20/2023 10:02    Procedures Procedures    Medications Ordered in ED Medications  iohexol (OMNIPAQUE) 300 MG/ML solution 100 mL (100 mLs Intravenous Contrast Given 04/20/23 0930)    ED Course/ Medical Decision Making/ A&P Clinical Course as of 04/20/23 1904  Sat Apr 20, 2023  1007 CT ABDOMEN PELVIS W CONTRAST Uterine fibroids [RP]    Clinical Course User Index [RP] Rondel Baton, MD                                 Medical Decision Making Amount and/or Complexity of Data Reviewed Labs: ordered. Radiology: ordered. Decision-making details documented in ED Course.  Risk Prescription drug management.   Annette Lewis is a 47 y.o. female with comorbidities that complicate the patient evaluation including PE on Eliquis who presents to the emergency department with lower abdominal pain for the past 2 days.     Initial  Ddx:  Appendicitis, diverticulitis, PID, uterine fibroids, ovarian cysts, muscle strain  MDM/Course:  Patient presents to the emergency department with lower abdominal pain that is worsened with moving her legs.  Nonseptic on exam and has lower abdominal tenderness to palpation.  Had a CT scan that showed fibroids without any of the above concerning diagnoses.  Suspect that these may be related to her symptoms.  Says that she is not having any urinary symptoms at this time and did have rare bacteria in her urine.  Will go ahead and prescribe her Keflex for this.  Upon re-evaluation patient remained stable.  Still unclear exactly what is causing her symptoms suspect may be a muscle strain or perhaps due to her fibroids.  Will have her follow-up with her primary doctor and OB/GYN to continue her workup.  This patient presents to the ED for concern of complaints listed in HPI, this involves an extensive number of treatment options, and is a complaint that carries with it a high risk of complications and morbidity. Disposition including potential need for admission considered.   Dispo: DC Home. Return precautions discussed including, but not limited to, those listed in the AVS. Allowed pt time to ask questions which were answered fully prior to dc.  Additional history obtained from spouse Records reviewed Outpatient Clinic Notes The following labs were independently interpreted: Chemistry and show no acute abnormality I independently reviewed the following imaging with scope of interpretation limited to determining acute life threatening conditions related to emergency care: CT Abdomen/Pelvis and agree with the radiologist interpretation with the following exceptions: none I personally reviewed and interpreted the pt's EKG: see above for interpretation  I have reviewed the patients home medications and made adjustments as needed   Final Clinical Impression(s) / ED Diagnoses Final diagnoses:  Lower  abdominal pain  Uterine leiomyoma, unspecified location  Bacteriuria    Rx / DC Orders ED Discharge Orders          Ordered    cephALEXin (KEFLEX) 500 MG capsule  2 times daily        04/20/23 1031              Rondel Baton, MD 04/20/23 1904

## 2023-04-20 NOTE — ED Notes (Signed)
Back from CT

## 2023-04-21 LAB — URINE CULTURE

## 2023-08-03 ENCOUNTER — Other Ambulatory Visit: Payer: Self-pay | Admitting: Hematology & Oncology

## 2023-08-07 ENCOUNTER — Telehealth: Payer: Self-pay | Admitting: Hematology & Oncology

## 2023-08-07 NOTE — Telephone Encounter (Addendum)
Called to schedule follow up appt. LVM for scheduling.   ----- Message from Nurse Arva Chafe sent at 08/06/2023 11:23 AM EST ----- Per Dr Gustavo Lah May note:   Return in about 6 months (around 07/05/2023) for sarah labs, appt.  Thank you! dph

## 2023-08-12 ENCOUNTER — Telehealth: Payer: Self-pay | Admitting: Hematology & Oncology

## 2023-08-12 NOTE — Telephone Encounter (Signed)
Called to schedule follow up appointment. LVM to return call for scheduling.

## 2023-08-21 ENCOUNTER — Ambulatory Visit: Payer: 59 | Admitting: Family Medicine

## 2023-08-27 ENCOUNTER — Inpatient Hospital Stay: Payer: 59 | Attending: Hematology & Oncology

## 2023-08-27 ENCOUNTER — Encounter: Payer: Self-pay | Admitting: Hematology & Oncology

## 2023-08-27 ENCOUNTER — Inpatient Hospital Stay (HOSPITAL_BASED_OUTPATIENT_CLINIC_OR_DEPARTMENT_OTHER): Payer: 59 | Admitting: Hematology & Oncology

## 2023-08-27 VITALS — BP 113/59 | HR 80 | Temp 99.0°F | Resp 18 | Ht 63.0 in | Wt 190.1 lb

## 2023-08-27 DIAGNOSIS — D500 Iron deficiency anemia secondary to blood loss (chronic): Secondary | ICD-10-CM

## 2023-08-27 DIAGNOSIS — I2609 Other pulmonary embolism with acute cor pulmonale: Secondary | ICD-10-CM

## 2023-08-27 DIAGNOSIS — Z7901 Long term (current) use of anticoagulants: Secondary | ICD-10-CM | POA: Diagnosis not present

## 2023-08-27 DIAGNOSIS — Z1231 Encounter for screening mammogram for malignant neoplasm of breast: Secondary | ICD-10-CM

## 2023-08-27 DIAGNOSIS — Z86718 Personal history of other venous thrombosis and embolism: Secondary | ICD-10-CM | POA: Diagnosis present

## 2023-08-27 DIAGNOSIS — E611 Iron deficiency: Secondary | ICD-10-CM | POA: Diagnosis not present

## 2023-08-27 DIAGNOSIS — D573 Sickle-cell trait: Secondary | ICD-10-CM | POA: Insufficient documentation

## 2023-08-27 LAB — CBC WITH DIFFERENTIAL (CANCER CENTER ONLY)
Abs Immature Granulocytes: 0.01 10*3/uL (ref 0.00–0.07)
Basophils Absolute: 0 10*3/uL (ref 0.0–0.1)
Basophils Relative: 0 %
Eosinophils Absolute: 0.1 10*3/uL (ref 0.0–0.5)
Eosinophils Relative: 2 %
HCT: 39.6 % (ref 36.0–46.0)
Hemoglobin: 13.4 g/dL (ref 12.0–15.0)
Immature Granulocytes: 0 %
Lymphocytes Relative: 29 %
Lymphs Abs: 1.8 10*3/uL (ref 0.7–4.0)
MCH: 28.8 pg (ref 26.0–34.0)
MCHC: 33.8 g/dL (ref 30.0–36.0)
MCV: 85 fL (ref 80.0–100.0)
Monocytes Absolute: 0.4 10*3/uL (ref 0.1–1.0)
Monocytes Relative: 7 %
Neutro Abs: 3.9 10*3/uL (ref 1.7–7.7)
Neutrophils Relative %: 62 %
Platelet Count: 263 10*3/uL (ref 150–400)
RBC: 4.66 MIL/uL (ref 3.87–5.11)
RDW: 13 % (ref 11.5–15.5)
WBC Count: 6.2 10*3/uL (ref 4.0–10.5)
nRBC: 0 % (ref 0.0–0.2)

## 2023-08-27 LAB — IRON AND IRON BINDING CAPACITY (CC-WL,HP ONLY)
Iron: 71 ug/dL (ref 28–170)
Saturation Ratios: 21 % (ref 10.4–31.8)
TIBC: 332 ug/dL (ref 250–450)
UIBC: 261 ug/dL (ref 148–442)

## 2023-08-27 LAB — CMP (CANCER CENTER ONLY)
ALT: 11 U/L (ref 0–44)
AST: 13 U/L — ABNORMAL LOW (ref 15–41)
Albumin: 4 g/dL (ref 3.5–5.0)
Alkaline Phosphatase: 47 U/L (ref 38–126)
Anion gap: 9 (ref 5–15)
BUN: 25 mg/dL — ABNORMAL HIGH (ref 6–20)
CO2: 22 mmol/L (ref 22–32)
Calcium: 9.2 mg/dL (ref 8.9–10.3)
Chloride: 109 mmol/L (ref 98–111)
Creatinine: 0.95 mg/dL (ref 0.44–1.00)
GFR, Estimated: >60 mL/min (ref >60)
Glucose, Bld: 95 mg/dL (ref 70–99)
Potassium: 4.3 mmol/L (ref 3.5–5.1)
Sodium: 140 mmol/L (ref 135–145)
Total Bilirubin: 0.3 mg/dL (ref <1.2)
Total Protein: 6.8 g/dL (ref 6.5–8.1)

## 2023-08-27 LAB — RETICULOCYTES
Immature Retic Fract: 7.6 % (ref 2.3–15.9)
RBC.: 4.57 MIL/uL (ref 3.87–5.11)
Retic Count, Absolute: 87.3 10*3/uL (ref 19.0–186.0)
Retic Ct Pct: 1.9 % (ref 0.4–3.1)

## 2023-08-27 LAB — LACTATE DEHYDROGENASE: LDH: 122 U/L (ref 98–192)

## 2023-08-27 LAB — FERRITIN: Ferritin: 52 ng/mL (ref 11–307)

## 2023-08-27 NOTE — Progress Notes (Signed)
Hematology and Oncology Follow Up Visit  Annette Lewis [id number] 1975/11/19 47 y.o. 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 as well as, Annette Lewis is quite busy.  Annette Lewis works for [employer].  This is the busy time a year for them.  Annette Lewis tried to make sure that everything gets delivered on time.  Should be going to the [place] in January on a cruise.  A friend is having a birthday.  Next year, it sounds like Annette Lewis will be going to [place].  Annette Lewis has had no problems with Eliquis.  Annette Lewis has had no bleeding.  Annette Lewis has had no problems with pain.  There is been no leg swelling of the right leg.  Annette Lewis does have sickle cell trait.  Annette Lewis does take folic acid for this.  Annette Lewis has had no problems with cough or shortness of breath..  Annette Lewis did have a mammogram in May 2024.  Everything turned out fine with the mammogram.  There is no problem with her iron stores.  We will see how they look.  Overall, Annette Lewis has had no change in bowel or bladder habits.  Annette Lewis says that Annette Lewis did go to the emergency room because of abdominal pain.  This was in August.  Annette Lewis had a CT scan done on August 17.  This was really unremarkable.  Annette Lewis is on Zepbound right now.  Annette Lewis is losing weight.  Annette Lewis wants to go down to about 140 pounds.  Overall, I would say that her performance status is probably ECOG 0.    Medications:  Allergies as of 08/27/2023   No Known Allergies      Medication List        Accurate as of August 27, 2023  8:24 AM. If you have any questions, ask your nurse or doctor.          STOP taking these medications    fluconazole 200 MG tablet Commonly known as: DIFLUCAN Stopped by: Josph Macho   metroNIDAZOLE 500 MG  tablet Commonly known as: FLAGYL Stopped by: Josph Macho       TAKE these medications    Eliquis 5 MG Tabs tablet Generic drug: apixaban TAKE 1 TABLET(5 MG) BY MOUTH TWICE DAILY   folic acid 1 MG tablet Commonly known as: FOLVITE Take 1 tablet (1 mg total) by mouth daily.   magnesium 30 MG tablet Take 30 mg by mouth 2 (two) times daily.   multivitamin tablet Take 1 tablet by mouth daily.   POTASSIUM CHLORIDE PO Take 15 mLs by mouth daily as needed. Potassium salt.   Vitamin D 125 MCG (5000 UT) Caps Take 5,000 Units by mouth daily.   Zepbound 2.5 MG/0.5ML Pen Generic drug: tirzepatide Inject 2.5 mg into the skin once a week.        Allergies: No Known Allergies  Past Medical History, Surgical history, Social history, and Family History were reviewed and updated.  Review of Systems: Review of Systems  Constitutional: Negative.   HENT: Negative.    Eyes: Negative.   Respiratory: Negative.    Cardiovascular: Negative.   Gastrointestinal: Negative.   Genitourinary: Negative.   Musculoskeletal: Negative.   Skin: Negative.   Neurological:  Negative.   Endo/Heme/Allergies: Negative.   Psychiatric/Behavioral: Negative.       Physical Exam:  Temperature is 99.  Pulse 80.  Blood pressure 113/59.  Weight is 190 pounds.  Wt Readings from Last 3 Encounters:  04/20/23 195 lb 1.7 oz (88.5 kg)  02/22/23 216 lb 0.8 oz (98 kg)  01/02/23 216 lb 1.9 oz (98 kg)    Physical Exam Vitals reviewed.  HENT:     Head: Normocephalic and atraumatic.  Eyes:     Pupils: Pupils are equal, round, and reactive to light.  Cardiovascular:     Rate and Rhythm: Normal rate and regular rhythm.     Heart sounds: Normal heart sounds.  Pulmonary:     Effort: Pulmonary effort is normal.     Breath sounds: Normal breath sounds.  Abdominal:     General: Bowel sounds are normal.     Palpations: Abdomen is soft.  Musculoskeletal:        General: No tenderness or deformity.  Normal range of motion.     Cervical back: Normal range of motion.  Lymphadenopathy:     Cervical: No cervical adenopathy.  Skin:    General: Skin is warm and dry.     Findings: No erythema or rash.  Neurological:     Mental Status: Annette Lewis is alert and oriented to person, place, and time.  Psychiatric:        Behavior: Behavior normal.        Thought Content: Thought content normal.        Judgment: Judgment normal.      Lab Results  Component Value Date   WBC 6.2 08/27/2023   HGB 13.4 08/27/2023   HCT 39.6 08/27/2023   MCV 85.0 08/27/2023   PLT 263 08/27/2023   Lab Results  Component Value Date   FERRITIN 10 (L) 05/23/2022   IRON 41 05/23/2022   TIBC 468 (H) 05/23/2022   UIBC 427 05/23/2022   IRONPCTSAT 9 (L) 05/23/2022   Lab Results  Component Value Date   RETICCTPCT 1.9 08/27/2023   RBC 4.57 08/27/2023   No results found for: "KPAFRELGTCHN", "LAMBDASER", "KAPLAMBRATIO" No results found for: "IGGSERUM", "IGA", "IGMSERUM" No results found for: "TOTALPROTELP", "ALBUMINELP", "A1GS", "A2GS", "BETS", "BETA2SER", "GAMS", "MSPIKE", "SPEI"   Chemistry      Component Value Date/Time   NA 137 04/20/2023 0840   K 3.7 04/20/2023 0840   CL 107 04/20/2023 0840   CO2 18 (L) 04/20/2023 0840   BUN 20 04/20/2023 0840   CREATININE 0.81 04/20/2023 0840   CREATININE 0.86 01/02/2023 1207      Component Value Date/Time   CALCIUM 8.7 (L) 04/20/2023 0840   ALKPHOS 56 04/20/2023 0840   AST 19 04/20/2023 0840   AST 16 01/02/2023 1207   ALT 18 04/20/2023 0840   ALT 15 01/02/2023 1207   BILITOT 0.5 04/20/2023 0840   BILITOT 0.4 01/02/2023 1207       Impression and Plan: Annette Lewis is a very pleasant 47 yo African American female with recent diagnosis of bilateral pulmonary emboli with right heart strain. Annette Lewis also has history of right lower extremity DVT about 5 years ago.   Again, I do not see any problems with her being on long-term Eliquis.  I know Annette Lewis travels quite a bit.  I  told her that if Annette Lewis is on any flight that is probably 5 hours or longer, it probably would not be a bad idea to use baby aspirin while Annette Lewis is  flying.  I would like to see her back in about 6 months.  I do not see that we have to do any scans or Dopplers on her.   Josph Macho, MD 12/24/20248:24 AM

## 2024-03-02 ENCOUNTER — Inpatient Hospital Stay: Payer: 59 | Attending: Hematology & Oncology

## 2024-03-02 ENCOUNTER — Inpatient Hospital Stay (HOSPITAL_BASED_OUTPATIENT_CLINIC_OR_DEPARTMENT_OTHER): Payer: 59 | Admitting: Family

## 2024-03-02 ENCOUNTER — Encounter: Payer: Self-pay | Admitting: Family

## 2024-03-02 VITALS — BP 113/70 | HR 92 | Temp 99.2°F | Resp 17 | Wt 169.1 lb

## 2024-03-02 DIAGNOSIS — Z86718 Personal history of other venous thrombosis and embolism: Secondary | ICD-10-CM | POA: Diagnosis present

## 2024-03-02 DIAGNOSIS — D6859 Other primary thrombophilia: Secondary | ICD-10-CM

## 2024-03-02 DIAGNOSIS — I2609 Other pulmonary embolism with acute cor pulmonale: Secondary | ICD-10-CM | POA: Diagnosis not present

## 2024-03-02 DIAGNOSIS — Z86711 Personal history of pulmonary embolism: Secondary | ICD-10-CM | POA: Insufficient documentation

## 2024-03-02 DIAGNOSIS — I2782 Chronic pulmonary embolism: Secondary | ICD-10-CM | POA: Diagnosis not present

## 2024-03-02 DIAGNOSIS — E611 Iron deficiency: Secondary | ICD-10-CM | POA: Diagnosis not present

## 2024-03-02 DIAGNOSIS — D573 Sickle-cell trait: Secondary | ICD-10-CM | POA: Insufficient documentation

## 2024-03-02 DIAGNOSIS — D500 Iron deficiency anemia secondary to blood loss (chronic): Secondary | ICD-10-CM

## 2024-03-02 DIAGNOSIS — Z7901 Long term (current) use of anticoagulants: Secondary | ICD-10-CM | POA: Diagnosis not present

## 2024-03-02 LAB — CBC WITH DIFFERENTIAL (CANCER CENTER ONLY)
Abs Immature Granulocytes: 0.01 10*3/uL (ref 0.00–0.07)
Basophils Absolute: 0 10*3/uL (ref 0.0–0.1)
Basophils Relative: 0 %
Eosinophils Absolute: 0.1 10*3/uL (ref 0.0–0.5)
Eosinophils Relative: 2 %
HCT: 40.3 % (ref 36.0–46.0)
Hemoglobin: 13.3 g/dL (ref 12.0–15.0)
Immature Granulocytes: 0 %
Lymphocytes Relative: 24 %
Lymphs Abs: 1.4 10*3/uL (ref 0.7–4.0)
MCH: 28.2 pg (ref 26.0–34.0)
MCHC: 33 g/dL (ref 30.0–36.0)
MCV: 85.4 fL (ref 80.0–100.0)
Monocytes Absolute: 0.4 10*3/uL (ref 0.1–1.0)
Monocytes Relative: 7 %
Neutro Abs: 3.7 10*3/uL (ref 1.7–7.7)
Neutrophils Relative %: 67 %
Platelet Count: 263 10*3/uL (ref 150–400)
RBC: 4.72 MIL/uL (ref 3.87–5.11)
RDW: 12.8 % (ref 11.5–15.5)
WBC Count: 5.6 10*3/uL (ref 4.0–10.5)
nRBC: 0 % (ref 0.0–0.2)

## 2024-03-02 LAB — CMP (CANCER CENTER ONLY)
ALT: 10 U/L (ref 0–44)
AST: 11 U/L — ABNORMAL LOW (ref 15–41)
Albumin: 4.3 g/dL (ref 3.5–5.0)
Alkaline Phosphatase: 48 U/L (ref 38–126)
Anion gap: 8 (ref 5–15)
BUN: 17 mg/dL (ref 6–20)
CO2: 27 mmol/L (ref 22–32)
Calcium: 9.4 mg/dL (ref 8.9–10.3)
Chloride: 104 mmol/L (ref 98–111)
Creatinine: 0.89 mg/dL (ref 0.44–1.00)
GFR, Estimated: >60 mL/min (ref >60)
Glucose, Bld: 85 mg/dL (ref 70–99)
Potassium: 4.3 mmol/L (ref 3.5–5.1)
Sodium: 139 mmol/L (ref 135–145)
Total Bilirubin: 0.5 mg/dL (ref 0.0–1.2)
Total Protein: 7.3 g/dL (ref 6.5–8.1)

## 2024-03-02 LAB — FERRITIN: Ferritin: 54 ng/mL (ref 11–307)

## 2024-03-02 LAB — IRON AND IRON BINDING CAPACITY (CC-WL,HP ONLY)
Iron: 66 ug/dL (ref 28–170)
Saturation Ratios: 19 % (ref 10.4–31.8)
TIBC: 340 ug/dL (ref 250–450)
UIBC: 274 ug/dL (ref 148–442)

## 2024-03-02 NOTE — Progress Notes (Signed)
 Hematology and Oncology Follow Up Visit  Hatsumi Steinhart [id number] [birth year] 48 y.o. 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 Leng is here today for follow-up. She is doing well and has no complaints at this time.  She started low dose testosterone injections 4-5 weeks ago to help improve her energy. So far she is feeling much better and it has increased her quality of life. We did discuss the effect this could have on her coagulation and risk for thrombotic event. She is taking her Eliquis  5 mg PO BID as prescribed to help prevent any recurrence.  No issue with abnormal blood loss. Cycle is regular with normal flow.  No bruising or petechiae.  No fever, chills, n/v, cough, rash, dizziness, SOB, chest pain, palpitations, abdominal pain or changes in bowel or bladder habits.  No swelling or tenderness in her extremities.  Tingling in her arms occurs on occasion and she feels this may be related to having eaten sugar/carbs.  No falls or syncope reported.  Appetite and hydration are good. Weight is stable at 169 lbs.    ECOG Performance Status: 0 - Asymptomatic  Medications:  Allergies as of 03/02/2024   No Known Allergies      Medication List        Accurate as of March 02, 2024  8:49 AM. If you have any questions, ask your nurse or doctor.          Eliquis  5 MG Tabs tablet Generic drug: apixaban  TAKE 1 TABLET(5 MG) BY MOUTH TWICE DAILY   folic acid  1 MG tablet Commonly known as: FOLVITE  Take 1 tablet (1 mg total) by mouth daily.   magnesium  30 MG tablet Take 30 mg by mouth 2 (two) times daily.   multivitamin tablet Take 1 tablet by mouth daily.   POTASSIUM CHLORIDE  PO Take 15 mLs by mouth daily as needed.  Potassium salt.   Vitamin D 125 MCG (5000 UT) Caps Take 5,000 Units by mouth daily.   Zepbound 2.5 MG/0.5ML Pen Generic drug: tirzepatide Inject 2.5 mg into the skin once a week.        Allergies: No Known Allergies  Past Medical History, Surgical history, Social history, and Family History were reviewed and updated.  Review of Systems: All other 10 point review of systems is negative.   Physical Exam:  weight is 169 lb 1.9 oz (76.7 kg). Her oral temperature is 99.2 F (37.3 C). Her blood pressure is 113/70 and her pulse is 92. Her respiration is 17 and oxygen saturation is 100%.   Wt Readings from Last 3 Encounters:  03/02/24 169 lb 1.9 oz (76.7 kg)  08/27/23 190 lb 1.9 oz (86.2 kg)  04/20/23 195 lb 1.7 oz (88.5 kg)    Ocular: Sclerae unicteric, pupils equal, round and reactive to light Ear-nose-throat: Oropharynx clear, dentition fair Lymphatic: No cervical or supraclavicular adenopathy Lungs no rales or rhonchi, good excursion bilaterally Heart regular rate and rhythm, no murmur appreciated Abd soft, nontender, positive bowel sounds MSK no focal spinal tenderness, no joint edema Neuro: non-focal, well-oriented, appropriate affect Breasts: Deferred   Lab Results  Component Value Date   WBC 5.6 03/02/2024   HGB 13.3 03/02/2024  HCT 40.3 03/02/2024   MCV 85.4 03/02/2024   PLT 263 03/02/2024   Lab Results  Component Value Date   FERRITIN 52 08/27/2023   IRON 71 08/27/2023   TIBC 332 08/27/2023   UIBC 261 08/27/2023   IRONPCTSAT 21 08/27/2023   Lab Results  Component Value Date   RETICCTPCT 1.9 08/27/2023   RBC 4.72 03/02/2024   No results found for: KPAFRELGTCHN, LAMBDASER, KAPLAMBRATIO No results found for: IGGSERUM, IGA, IGMSERUM No results found for: STEPHANY RINGS, A1GS, MARKEL EARLA JOANNIE DOC VICK, SPEI   Chemistry      Component Value Date/Time   NA 140 08/27/2023 0803   K 4.3 08/27/2023 0803    CL 109 08/27/2023 0803   CO2 22 08/27/2023 0803   BUN 25 (H) 08/27/2023 0803   CREATININE 0.95 08/27/2023 0803      Component Value Date/Time   CALCIUM 9.2 08/27/2023 0803   ALKPHOS 47 08/27/2023 0803   AST 13 (L) 08/27/2023 0803   ALT 11 08/27/2023 0803   BILITOT 0.3 08/27/2023 0803       Impression and Plan: Ms. Goodreau is a very pleasant 48 yo African American female with history of bilateral pulmonary emboli with right heart strain. She also has remote history of right lower extremity DVT over 5 years ago.  She continues to do well on Eliquis  and so far there has been no new recurrence.  Iron studies are pending.  Continue daily folic acid  for history of [place] trait.  Follow-up in 6 months.   Lauraine Pepper, NP 6/30/20258:49 AM

## 2024-03-16 ENCOUNTER — Ambulatory Visit (INDEPENDENT_AMBULATORY_CARE_PROVIDER_SITE_OTHER): Admitting: Internal Medicine

## 2024-03-16 ENCOUNTER — Institutional Professional Consult (permissible substitution) (HOSPITAL_BASED_OUTPATIENT_CLINIC_OR_DEPARTMENT_OTHER): Admitting: Internal Medicine

## 2024-03-16 ENCOUNTER — Encounter (HOSPITAL_BASED_OUTPATIENT_CLINIC_OR_DEPARTMENT_OTHER): Payer: Self-pay | Admitting: Internal Medicine

## 2024-03-16 ENCOUNTER — Telehealth (HOSPITAL_BASED_OUTPATIENT_CLINIC_OR_DEPARTMENT_OTHER): Payer: Self-pay

## 2024-03-16 VITALS — BP 129/86 | HR 94 | Ht 63.0 in | Wt 171.0 lb

## 2024-03-16 DIAGNOSIS — Z86718 Personal history of other venous thrombosis and embolism: Secondary | ICD-10-CM

## 2024-03-16 DIAGNOSIS — E7800 Pure hypercholesterolemia, unspecified: Secondary | ICD-10-CM

## 2024-03-16 DIAGNOSIS — Z86711 Personal history of pulmonary embolism: Secondary | ICD-10-CM | POA: Diagnosis not present

## 2024-03-16 NOTE — Patient Instructions (Signed)
 Medication Instructions:  Your physician recommends that you continue on your current medications as directed. Please refer to the Current Medication list given to you today.  *If you need a refill on your cardiac medications before your next appointment, please call your pharmacy*  Lab Work: LPa today If you have labs (blood work) drawn today and your tests are completely normal, you will receive your results only by: MyChart Message (if you have MyChart) OR A paper copy in the mail If you have any lab test that is abnormal or we need to change your treatment, we will call you to review the results.  Testing/Procedures: Genetic test Cheek swab completed in office Specimen and necessary paperwork mailed.   Follow-Up: At [facility], you and your health needs are our priority.  As part of our continuing mission to provide you with exceptional heart care, our providers are all part of one team.  This team includes your primary Cardiologist (physician) and Advanced Practice Providers or APPs (Physician Assistants and Nurse Practitioners) who all work together to provide you with the care you need, when you need it.  Your next appointment:   TBD- Based on genetic test and lab results  Other Instructions Genetic testing was completed in office. Results take about 2-3 weeks to come back. Dr. Mona will reach out with results.

## 2024-03-16 NOTE — Telephone Encounter (Signed)
 Genetic test for Hypercholesteremia ordered ([facility]) Cheek swab completed in office Specimen and necessary paperwork mailed. ID: [id number]

## 2024-03-17 LAB — LIPOPROTEIN A (LPA): Lipoprotein (a): 161.8 nmol/L — ABNORMAL HIGH (ref <75.0)

## 2024-03-17 NOTE — Progress Notes (Signed)
 LIPID CLINIC CONSULT NOTE  Chief Complaint:  Manage dyslipidemia  Primary Care Physician: Gladystine Erminio CROME, MD  Primary Cardiologist:  None  HPI:  Annette Lewis is a 48 y.o. female who is being seen today for the evaluation of dyslipidemia at the request of Gladystine, Erminio CROME, MD. This is a pleasant 48 year old female kindly referred for evaluation and management of dyslipidemia.  Annette Lewis has a history of high cholesterol although has typically had a large LDL particle size.  She underwent cholesterol testing in April 2025 at [facility] which showed total cholesterol 352, triglycerides 72, HDL 78 and LDL of 264.  Her LDL particle number was high at 2412 although the number of small LDL particles was less than 90.  Overall LDL size was high at 22.6 nm.  These findings suggest a possible familial hyperlipidemia however the significance as to whether or not it may cause cardiovascular disease is unclear given the small particle numbers that are very low.  Of note she has a history of DVT and PE in the past.  She has been on Eliquis  for this.  She has not been on any lipid-lowering medications due to her feeling that the elevated cholesterol may not be necessarily pathogenic.  PMHx:  Past Medical History:  Diagnosis Date   DVT (deep venous thrombosis) (HCC)    Pulmonary embolism (HCC)     Past Surgical History:  Procedure Laterality Date   CESAREAN SECTION     IR ANGIOGRAM PULMONARY BILATERAL SELECTIVE  01/27/2021   IR INFUSION THROMBOL ARTERIAL INITIAL (Annette)  01/27/2021   IR US  GUIDE VASC ACCESS RIGHT  01/27/2021   IR US  GUIDE VASC ACCESS RIGHT  01/27/2021    FAMHx:  History reviewed. No pertinent family history.  No significant premature onset coronary disease  SOCHx:   reports that she has never smoked. She has never used smokeless tobacco. She reports that she does not currently use alcohol. She reports that she does not use drugs.  ALLERGIES:  No Known  Allergies  ROS: Pertinent items noted in HPI and remainder of comprehensive ROS otherwise negative.  HOME MEDS: Current Outpatient Medications on File Prior to Visit  Medication Sig Dispense Refill   ELIQUIS  5 MG TABS tablet TAKE 1 TABLET(5 MG) BY MOUTH TWICE DAILY 60 tablet 6   folic acid  (FOLVITE ) 1 MG tablet Take 1 tablet (1 mg total) by mouth daily. 90 tablet 6   magnesium  30 MG tablet Take 30 mg by mouth 2 (two) times daily.     Multiple Vitamin (MULTIVITAMIN) tablet Take 1 tablet by mouth daily.     ZEPBOUND 2.5 MG/0.5ML Pen Inject 2.5 mg into the skin once a week. (Patient taking differently: Inject 10 mg into the skin once a week.)     Cholecalciferol (VITAMIN D) 125 MCG (5000 UT) CAPS Take 5,000 Units by mouth daily. (Patient not taking: Reported on 03/16/2024)     POTASSIUM CHLORIDE  PO Take 15 mLs by mouth daily as needed. Potassium salt. (Patient not taking: Reported on 03/16/2024)     No current facility-administered medications on file prior to visit.    LABS/IMAGING: Results for orders placed or performed in visit on 03/16/24 (from the past 48 hours)  Lipoprotein A (LPA)     Status: Abnormal   Collection Time: 03/16/24 10:23 AM  Result Value Ref Range   Lipoprotein (a) 161.8 (H) <75.0 nmol/L    Comment: Note:  Values greater than or equal to 75.0 nmol/L may  indicate an independent risk factor for CHD,        but must be evaluated with caution when applied        to non-Caucasian populations due to the        influence of genetic factors on Lp(a) across        ethnicities.    No results found.  LIPID PANEL: No results found for: CHOL, TRIG, HDL, CHOLHDL, VLDL, LDLCALC, LDLDIRECT  Lipoprotein (a)  Date/Time Value Ref Range Status  03/16/2024 10:23 AM 161.8 (H) <75.0 nmol/L Final    Comment:    Note:  Values greater than or equal to 75.0 nmol/L may        indicate an independent risk factor for CHD,        but must be evaluated with caution  when applied        to non-Caucasian populations due to the        influence of genetic factors on Lp(a) across        ethnicities.      WEIGHTS: Wt Readings from Last 3 Encounters:  03/16/24 171 lb (77.6 kg)  03/02/24 169 lb 1.9 oz (76.7 kg)  08/27/23 190 lb 1.9 oz (86.2 kg)    VITALS: BP 129/86 (BP Location: Left Arm, Patient Position: Sitting, Cuff Size: Normal)   Pulse 94   Ht 5' 3 (1.6 m)   Wt 171 lb (77.6 kg)   LMP 03/13/2024   SpO2 100%   BMI 30.29 kg/m   EXAM: Deferred  EKG: Deferred  ASSESSMENT: Dyslipidemia with LDL greater than 190, possible familial hyperlipidemia History of DVT/PE  PLAN: 1.   Annette Lewis has a history of very high cholesterol which is suggestive of familial hyperlipidemia although her particle testing suggest primarily large fluffy particles.  This is typically lower risk.  Could be associated with an APO B variant.  I discussed genetic testing with her today which I think would be very helpful and if certainly she is found to have a pathogenic variant I would strongly recommend therapy.  In addition, we should check LP(a).  She has a history of DVT and PE in clotting may be increased given the procoagulant properties of LP(a) if that value is high.  I will contact her with those results and we will make a plan together whether or not therapy is indicated and to what degree.  Thanks again for the kind referral  Annette KYM Maxcy, MD, [facility], FNLA, FACP  [place]  [facility]  Medical Director of the Advanced Lipid Disorders &  Cardiovascular Risk Reduction Clinic Diplomate of the American Board of Clinical Lipidology Attending Cardiologist  Direct Dial: [phone]  Fax: [phone]  Website:  [email or web address] Annette Lewis Mihcael Ledee 03/17/2024, 1:23 PM

## 2024-03-20 ENCOUNTER — Ambulatory Visit: Payer: Self-pay | Admitting: Internal Medicine

## 2024-04-10 ENCOUNTER — Other Ambulatory Visit: Payer: Self-pay | Admitting: Hematology & Oncology

## 2024-04-16 ENCOUNTER — Encounter (HOSPITAL_BASED_OUTPATIENT_CLINIC_OR_DEPARTMENT_OTHER): Payer: Self-pay | Admitting: Internal Medicine

## 2024-05-01 NOTE — Telephone Encounter (Signed)
 Left message for patient that MD sent her genetic test results and recommendations to MyChart

## 2024-05-11 NOTE — Telephone Encounter (Signed)
 Last read by Kenda Haddock at 5:06PM on 05/07/2024.   Will await patient reply.

## 2024-05-11 NOTE — Telephone Encounter (Signed)
 Patient has viewed MD comments re: genetic test. Will await reply

## 2024-09-14 ENCOUNTER — Inpatient Hospital Stay

## 2024-09-14 ENCOUNTER — Ambulatory Visit: Admitting: Family

## 2024-09-23 ENCOUNTER — Inpatient Hospital Stay: Attending: Hematology & Oncology

## 2024-09-23 ENCOUNTER — Inpatient Hospital Stay: Admitting: Family

## 2024-09-23 VITALS — BP 134/80 | HR 81 | Temp 97.9°F | Resp 17 | Ht 63.0 in | Wt 173.8 lb

## 2024-09-23 DIAGNOSIS — I2609 Other pulmonary embolism with acute cor pulmonale: Secondary | ICD-10-CM | POA: Diagnosis not present

## 2024-09-23 DIAGNOSIS — D500 Iron deficiency anemia secondary to blood loss (chronic): Secondary | ICD-10-CM | POA: Diagnosis not present

## 2024-09-23 DIAGNOSIS — D6859 Other primary thrombophilia: Secondary | ICD-10-CM

## 2024-09-23 DIAGNOSIS — I2782 Chronic pulmonary embolism: Secondary | ICD-10-CM

## 2024-09-23 LAB — CMP (CANCER CENTER ONLY)
ALT: 13 U/L (ref 0–44)
AST: 19 U/L (ref 15–41)
Albumin: 4.1 g/dL (ref 3.5–5.0)
Alkaline Phosphatase: 53 U/L (ref 38–126)
Anion gap: 12 (ref 5–15)
BUN: 14 mg/dL (ref 6–20)
CO2: 24 mmol/L (ref 22–32)
Calcium: 9.3 mg/dL (ref 8.9–10.3)
Chloride: 103 mmol/L (ref 98–111)
Creatinine: 1.17 mg/dL — ABNORMAL HIGH (ref 0.44–1.00)
GFR, Estimated: 58 mL/min — ABNORMAL LOW
Glucose, Bld: 83 mg/dL (ref 70–99)
Potassium: 4.4 mmol/L (ref 3.5–5.1)
Sodium: 139 mmol/L (ref 135–145)
Total Bilirubin: 0.4 mg/dL (ref 0.0–1.2)
Total Protein: 7 g/dL (ref 6.5–8.1)

## 2024-09-23 LAB — CBC WITH DIFFERENTIAL (CANCER CENTER ONLY)
Abs Immature Granulocytes: 0 K/uL (ref 0.00–0.07)
Band Neutrophils: 0 %
Basophils Absolute: 0 K/uL (ref 0.0–0.1)
Basophils Relative: 1 %
Eosinophils Absolute: 0.1 K/uL (ref 0.0–0.5)
Eosinophils Relative: 2 %
HCT: 39.4 % (ref 36.0–46.0)
Hemoglobin: 13.1 g/dL (ref 12.0–15.0)
Immature Granulocytes: 0 %
Lymphocytes Relative: 22 %
Lymphs Abs: 1.3 K/uL (ref 0.7–4.0)
MCH: 27.7 pg (ref 26.0–34.0)
MCHC: 33.2 g/dL (ref 30.0–36.0)
MCV: 83.3 fL (ref 80.0–100.0)
Monocytes Absolute: 0.4 K/uL (ref 0.1–1.0)
Monocytes Relative: 8 %
Neutro Abs: 3.9 K/uL (ref 1.7–7.7)
Neutrophils Relative %: 67 %
Platelet Count: 273 K/uL (ref 150–400)
RBC: 4.73 MIL/uL (ref 3.87–5.11)
RDW: 13 % (ref 11.5–15.5)
WBC Count: 5.7 K/uL (ref 4.0–10.5)
nRBC: 0 % (ref 0.0–0.2)
nRBC: 0 /100{WBCs}

## 2024-09-23 LAB — IRON AND IRON BINDING CAPACITY (CC-WL,HP ONLY)
Iron: 42 ug/dL (ref 28–170)
Saturation Ratios: 11 % (ref 10.4–31.8)
TIBC: 392 ug/dL (ref 250–450)
UIBC: 350 ug/dL

## 2024-09-23 LAB — FERRITIN: Ferritin: 21 ng/mL (ref 11–307)

## 2024-09-23 NOTE — Progress Notes (Signed)
 " Hematology and Oncology Follow Up Visit  Annette Lewis [id number] [birth year] 49 y.o. 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 She is doing well and has no complaints at this time.  No blood loss noted. She is doing well on Eliquis . No issues.  She noted pain in the right calf after exercising but this went away by the next day. I encouraged her to wear a compression stocking when exercising for added support.  No swelling, tenderness, numbness or tingling in her extremities at this time.  No falls or syncope.  No fever, chills, n/v, cough, rash, dizziness, SOB, chest pain, palpitations, abdominal pain or changes in bowel or bladder habits.  Appetite and hydration are good. Weight is stable at 173 lbs.   ECOG Performance Status: 1 - Symptomatic but completely ambulatory  Medications:  Allergies as of 09/23/2024   No Known Allergies      Medication List        Accurate as of September 23, 2024  1:24 PM. If you have any questions, ask your nurse or doctor.          Eliquis  5 MG Tabs tablet Generic drug: apixaban  TAKE 1 TABLET(5 MG) BY MOUTH TWICE DAILY   folic acid  1 MG tablet Commonly known as: FOLVITE  Take 1 tablet (1 mg total) by mouth daily.   magnesium  30 MG tablet Take 30 mg by mouth 2 (two) times daily.   multivitamin tablet Take 1 tablet by mouth daily.   POTASSIUM CHLORIDE  PO Take 15 mLs by mouth daily as needed. Potassium salt.   Vitamin D 125 MCG (5000 UT) Caps Take 5,000 Units by mouth daily.   Zepbound 2.5 MG/0.5ML Pen Generic drug: tirzepatide Inject 2.5 mg into the skin once a week. What changed: how much to take   Zepbound 15 MG/0.5ML Pen Generic drug:  tirzepatide SMARTSIG:15 Milligram(s) SUB-Q Once a Week What changed: Another medication with the same name was changed. Make sure you understand how and when to take each.        Allergies: Allergies[1]  Past Medical History, Surgical history, Social history, and Family History were reviewed and updated.  Review of Systems: All other 10 point review of systems is negative.   Physical Exam:  height is 5' 3 (1.6 m) and weight is 173 lb 12.8 oz (78.8 kg). Her oral temperature is 97.9 F (36.6 C). Her blood pressure is 134/80 and her pulse is 81. Her respiration is 17 and oxygen saturation is 98%.   Wt Readings from Last 3 Encounters:  09/23/24 173 lb 12.8 oz (78.8 kg)  03/16/24 171 lb (77.6 kg)  03/02/24 169 lb 1.9 oz (76.7 kg)    Ocular: Sclerae unicteric, pupils equal, round and reactive to light Ear-nose-throat: Oropharynx clear, dentition fair Lymphatic: No cervical or supraclavicular adenopathy Lungs no rales or rhonchi, good excursion bilaterally Heart regular rate and rhythm, no murmur appreciated Abd soft, nontender, positive bowel sounds MSK no focal spinal tenderness, no joint edema Neuro: non-focal, well-oriented, appropriate affect Breasts: Deferred   Lab Results  Component Value Date   WBC 5.6 03/02/2024   HGB 13.3 03/02/2024   HCT 40.3 03/02/2024  MCV 85.4 03/02/2024   PLT 263 03/02/2024   Lab Results  Component Value Date   FERRITIN 54 03/02/2024   IRON 66 03/02/2024   TIBC 340 03/02/2024   UIBC 274 03/02/2024   IRONPCTSAT 19 03/02/2024   Lab Results  Component Value Date   RETICCTPCT 1.9 08/27/2023   RBC 4.72 03/02/2024   No results found for: KPAFRELGTCHN, LAMBDASER, KAPLAMBRATIO No results found for: IGGSERUM, IGA, IGMSERUM No results found for: STEPHANY CARLOTA BENSON MARKEL EARLA JOANNIE DOC VICK, SPEI   Chemistry      Component Value Date/Time   NA 139 03/02/2024 0824   K 4.3 03/02/2024 0824    CL 104 03/02/2024 0824   CO2 27 03/02/2024 0824   BUN 17 03/02/2024 0824   CREATININE 0.89 03/02/2024 0824      Component Value Date/Time   CALCIUM 9.4 03/02/2024 0824   ALKPHOS 48 03/02/2024 0824   AST 11 (L) 03/02/2024 0824   ALT 10 03/02/2024 0824   BILITOT 0.5 03/02/2024 0824       Impression and Plan: Annette Lewis is a very pleasant 49 yo African American female with history of bilateral pulmonary emboli with right heart strain. She also has remote history of right lower extremity DVT over 5 years ago.  She continues to do well on Eliquis  and so far there has been no new recurrence.  Iron studies are pending.  Continue daily folic acid  for history of [place] trait.  Follow-up in 6 months.   Lauraine Pepper, NP 1/21/20261:24 PM     [1] No Known Allergies  "

## 2025-03-22 ENCOUNTER — Inpatient Hospital Stay: Admitting: Family

## 2025-03-22 ENCOUNTER — Inpatient Hospital Stay
